# Patient Record
Sex: Male | Born: 1960 | Race: Black or African American | Hispanic: No | Marital: Married | Smoking: Former smoker
Health system: Southern US, Community
[De-identification: ages and names within clinical notes are randomized; demographics above are authoritative.]

## PROBLEM LIST (undated history)

## (undated) DIAGNOSIS — A048 Other specified bacterial intestinal infections: Secondary | ICD-10-CM

## (undated) DIAGNOSIS — K219 Gastro-esophageal reflux disease without esophagitis: Secondary | ICD-10-CM

## (undated) HISTORY — PX: NO PAST SURGERIES: SHX2092

## (undated) HISTORY — DX: Gastro-esophageal reflux disease without esophagitis: K21.9

## (undated) HISTORY — DX: Other specified bacterial intestinal infections: A04.8

---

## 2009-03-27 ENCOUNTER — Emergency Department (HOSPITAL_COMMUNITY): Admission: EM | Admit: 2009-03-27 | Discharge: 2009-03-27 | Payer: Self-pay | Admitting: Emergency Medicine

## 2016-03-07 ENCOUNTER — Ambulatory Visit (INDEPENDENT_AMBULATORY_CARE_PROVIDER_SITE_OTHER): Payer: Self-pay | Admitting: Physician Assistant

## 2016-03-07 ENCOUNTER — Ambulatory Visit (INDEPENDENT_AMBULATORY_CARE_PROVIDER_SITE_OTHER): Payer: Self-pay

## 2016-03-07 VITALS — BP 120/78 | HR 95 | Temp 97.9°F | Resp 17 | Wt 166.0 lb

## 2016-03-07 DIAGNOSIS — R079 Chest pain, unspecified: Secondary | ICD-10-CM

## 2016-03-07 LAB — POCT CBC
Granulocyte percent: 47.1 %G (ref 37–80)
HCT, POC: 39.9 % — AB (ref 43.5–53.7)
Hemoglobin: 13.7 g/dL — AB (ref 14.1–18.1)
LYMPH, POC: 1.6 (ref 0.6–3.4)
MCH, POC: 28.1 pg (ref 27–31.2)
MCHC: 34.2 g/dL (ref 31.8–35.4)
MCV: 82.2 fL (ref 80–97)
MID (cbc): 0.3 (ref 0–0.9)
MPV: 7.3 fL (ref 0–99.8)
PLATELET COUNT, POC: 153 10*3/uL (ref 142–424)
POC Granulocyte: 1.7 — AB (ref 2–6.9)
POC LYMPH PERCENT: 44.2 %L (ref 10–50)
POC MID %: 8.7 %M (ref 0–12)
RBC: 4.86 M/uL (ref 4.69–6.13)
RDW, POC: 14.9 %
WBC: 3.7 10*3/uL — AB (ref 4.6–10.2)

## 2016-03-07 MED ORDER — OMEPRAZOLE 40 MG PO CPDR: DELAYED_RELEASE_CAPSULE | ORAL | 1 refills | Status: DC

## 2016-03-07 NOTE — Patient Instructions (Signed)
     IF you received an x-ray today, you will receive an invoice from [facility]. Please contact [facility] at [phone] with questions or concerns regarding your invoice.   IF you received labwork today, you will receive an invoice from [facility]. Please contact [facility] at [phone] with questions or concerns regarding your invoice.   Our billing staff will not be able to assist you with questions regarding bills from these companies.  You will be contacted with the lab results as soon as they are available. The fastest way to get your results is to activate your My Chart account. Instructions are located on the last page of this paperwork. If you have not heard from us regarding the results in 2 weeks, please contact this office.      

## 2016-03-07 NOTE — Progress Notes (Signed)
03/07/2016 6:11 PM   DOB: Aug 07, 1960 / MRN: [id number]  SUBJECTIVE:  Travis Oconnell is a 55 y.o. male presenting for centralized substernal chest pain that he describes as severe, and states "it feels like it is eating me from the inside out." This is made worse by eating, and particularly worse by eating heavy foods.  He is not sure if the pain is worse with eating. Denies dysphagia, odynophagia, food getting stuck.  Reports a 10 lbs weight loss over the last 2 months.  Can only tolerate rice and salads.  Feels he is getting worse. Associates one bloody stool about three weeks ago.   He has been coughing more lately, has a hard time catching his breath if he inhales any secondhand smoke or dust.  Denies any new DOE.  He is a never smoker and denies a history of asthma.  Has been tried on an inhaler and this did not really help.   He has no allergies on file.   He  has no past medical history on file.    He  reports that he has never smoked. He does not have any smokeless tobacco history on file. He  has no sexual activity history on file. The patient  has no past surgical history on file.  His family history is not on file.  Review of Systems  Constitutional: Positive for weight loss. Negative for chills, diaphoresis, fever and malaise/fatigue.  Respiratory: Positive for cough. Negative for hemoptysis, sputum production, shortness of breath and wheezing.   Cardiovascular: Positive for chest pain. Negative for palpitations, orthopnea, claudication, leg swelling and PND.  Gastrointestinal: Negative for nausea.  Genitourinary: Negative for dysuria.  Musculoskeletal: Negative for myalgias.  Skin: Negative for itching and rash.  Neurological: Negative for dizziness, weakness and headaches.    The problem list and medications were reviewed and updated by myself where necessary and exist elsewhere in the encounter.   OBJECTIVE:  BP 120/78 (BP Location: Right Arm, Patient Position: Sitting,  Cuff Size: Normal)   Pulse 95   Temp 97.9 F (36.6 C) (Oral)   Resp 17   Wt 166 lb (75.3 kg)   SpO2 98%   Physical Exam  Constitutional: He is oriented to person, place, and time.  Cardiovascular: Normal rate, regular rhythm, normal heart sounds and intact distal pulses.   Pulmonary/Chest: Effort normal and breath sounds normal. No respiratory distress. He has no wheezes. He has no rales. He exhibits no tenderness.  Abdominal: Soft. Bowel sounds are normal. He exhibits no distension and no mass. There is tenderness (left upper quadrant). There is no rebound and no guarding.  Musculoskeletal: Normal range of motion.  Neurological: He is alert and oriented to person, place, and time.    Results for orders placed or performed in visit on 03/07/16 (from the past 72 hour(s))  POCT CBC     Status: Abnormal   Collection Time: 03/07/16  5:38 PM  Result Value Ref Range   WBC 3.7 (A) 4.6 - 10.2 K/uL   Lymph, poc 1.6 0.6 - 3.4   POC LYMPH PERCENT 44.2 10 - 50 %L   MID (cbc) 0.3 0 - 0.9   POC MID % 8.7 0 - 12 %M   POC Granulocyte 1.7 (A) 2 - 6.9   Granulocyte percent 47.1 37 - 80 %G   RBC 4.86 4.69 - 6.13 M/uL   Hemoglobin 13.7 (A) 14.1 - 18.1 g/dL   HCT, POC 39.9 (A) 43.5 - 53.7 %  MCV 82.2 80 - 97 fL   MCH, POC 28.1 27 - 31.2 pg   MCHC 34.2 31.8 - 35.4 g/dL   RDW, POC 14.9 %   Platelet Count, POC 153 142 - 424 K/uL   MPV 7.3 0 - 99.8 fL    Dg Chest 2 View  Result Date: 03/07/2016 CLINICAL DATA:  Substernal chest pain. EXAM: CHEST  2 VIEW COMPARISON:  None. FINDINGS: The cardiac silhouette, mediastinal and hilar contours are within normal limits. There is mild tortuosity of the thoracic aorta. The lungs are clear. No pleural effusion. The bony thorax is intact. IMPRESSION: No acute cardiopulmonary findings. Electronically Signed   By: Marijo Sanes M.D.   On: 03/07/2016 18:06   EKG: NSR without evidence of hypertrophy, ischemia, or infarction.   ASSESSMENT AND PLAN  Donte was  seen today for abdominal pain and chest pain.  Diagnoses and all orders for this visit:  Chest pain, unspecified chest pain type: I have a strong clinical suspicion for GERD. He has some red flags: he is over the age of 76 and this is new for him.  He has lost weight. Unfortunately he dose not have health insurance. After giving him 300 mg of ranitidine his symptoms did remiss.  [id number].   -     DG Chest 2 View; Future -     EKG 12-Lead -     POCT CBC -     Lipase -     COMPLETE METABOLIC PANEL WITH GFR       -     H. Pylori breath test       -     AMB GI.   The patient is advised to call or return to clinic if he does not see an improvement in symptoms, or to seek the care of the closest emergency department if he worsens with the above plan.   Philis Fendt, MHS, PA-C [facility] 03/07/2016 6:11 PM

## 2016-03-08 LAB — COMPLETE METABOLIC PANEL WITH GFR
ALBUMIN: 3.7 g/dL (ref 3.6–5.1)
ALK PHOS: 49 U/L (ref 40–115)
ALT: 16 U/L (ref 9–46)
AST: 16 U/L (ref 10–35)
BUN: 15 mg/dL (ref 7–25)
CO2: 30 mmol/L (ref 20–31)
Calcium: 9.1 mg/dL (ref 8.6–10.3)
Chloride: 105 mmol/L (ref 98–110)
Creat: 1.07 mg/dL (ref 0.70–1.33)
GFR, EST NON AFRICAN AMERICAN: 77 mL/min (ref >=60)
GFR, Est African American: 89 mL/min (ref >=60)
GLUCOSE: 99 mg/dL (ref 65–99)
POTASSIUM: 4.7 mmol/L (ref 3.5–5.3)
Sodium: 141 mmol/L (ref 135–146)
Total Bilirubin: 0.5 mg/dL (ref 0.2–1.2)
Total Protein: 6.3 g/dL (ref 6.1–8.1)

## 2016-03-08 LAB — LIPASE: Lipase: 22 U/L (ref 7–60)

## 2016-03-09 ENCOUNTER — Other Ambulatory Visit: Payer: Self-pay | Admitting: Physician Assistant

## 2016-03-09 DIAGNOSIS — A048 Other specified bacterial intestinal infections: Secondary | ICD-10-CM

## 2016-03-09 LAB — H. PYLORI BREATH TEST: H. pylori Breath Test: DETECTED — AB

## 2016-03-09 MED ORDER — CLARITHROMYCIN 500 MG PO TABS: 500.0000 mg | ORAL_TABLET | Freq: Two times a day (BID) | ORAL | 0 refills | Status: DC

## 2016-03-09 MED ORDER — AMOXICILLIN 500 MG PO CAPS: 1000.0000 mg | ORAL_CAPSULE | Freq: Two times a day (BID) | ORAL | 0 refills | Status: DC

## 2016-03-09 NOTE — Progress Notes (Signed)
Jocelyn please relay treatment regimen to patient. Philis Fendt, MS, PA-C 5:03 PM, 03/09/2016

## 2016-04-02 NOTE — Progress Notes (Signed)
Patient's brother was made aware of treatment on 03/09/2016. Please see documentation note

## 2017-06-18 IMAGING — DX DG CHEST 2V
2 series · 2 of 2 positions shown · non-contrast
Comparison: None.

CLINICAL DATA: Substernal chest pain.

EXAM:
CHEST  2 VIEW

[chest pa]
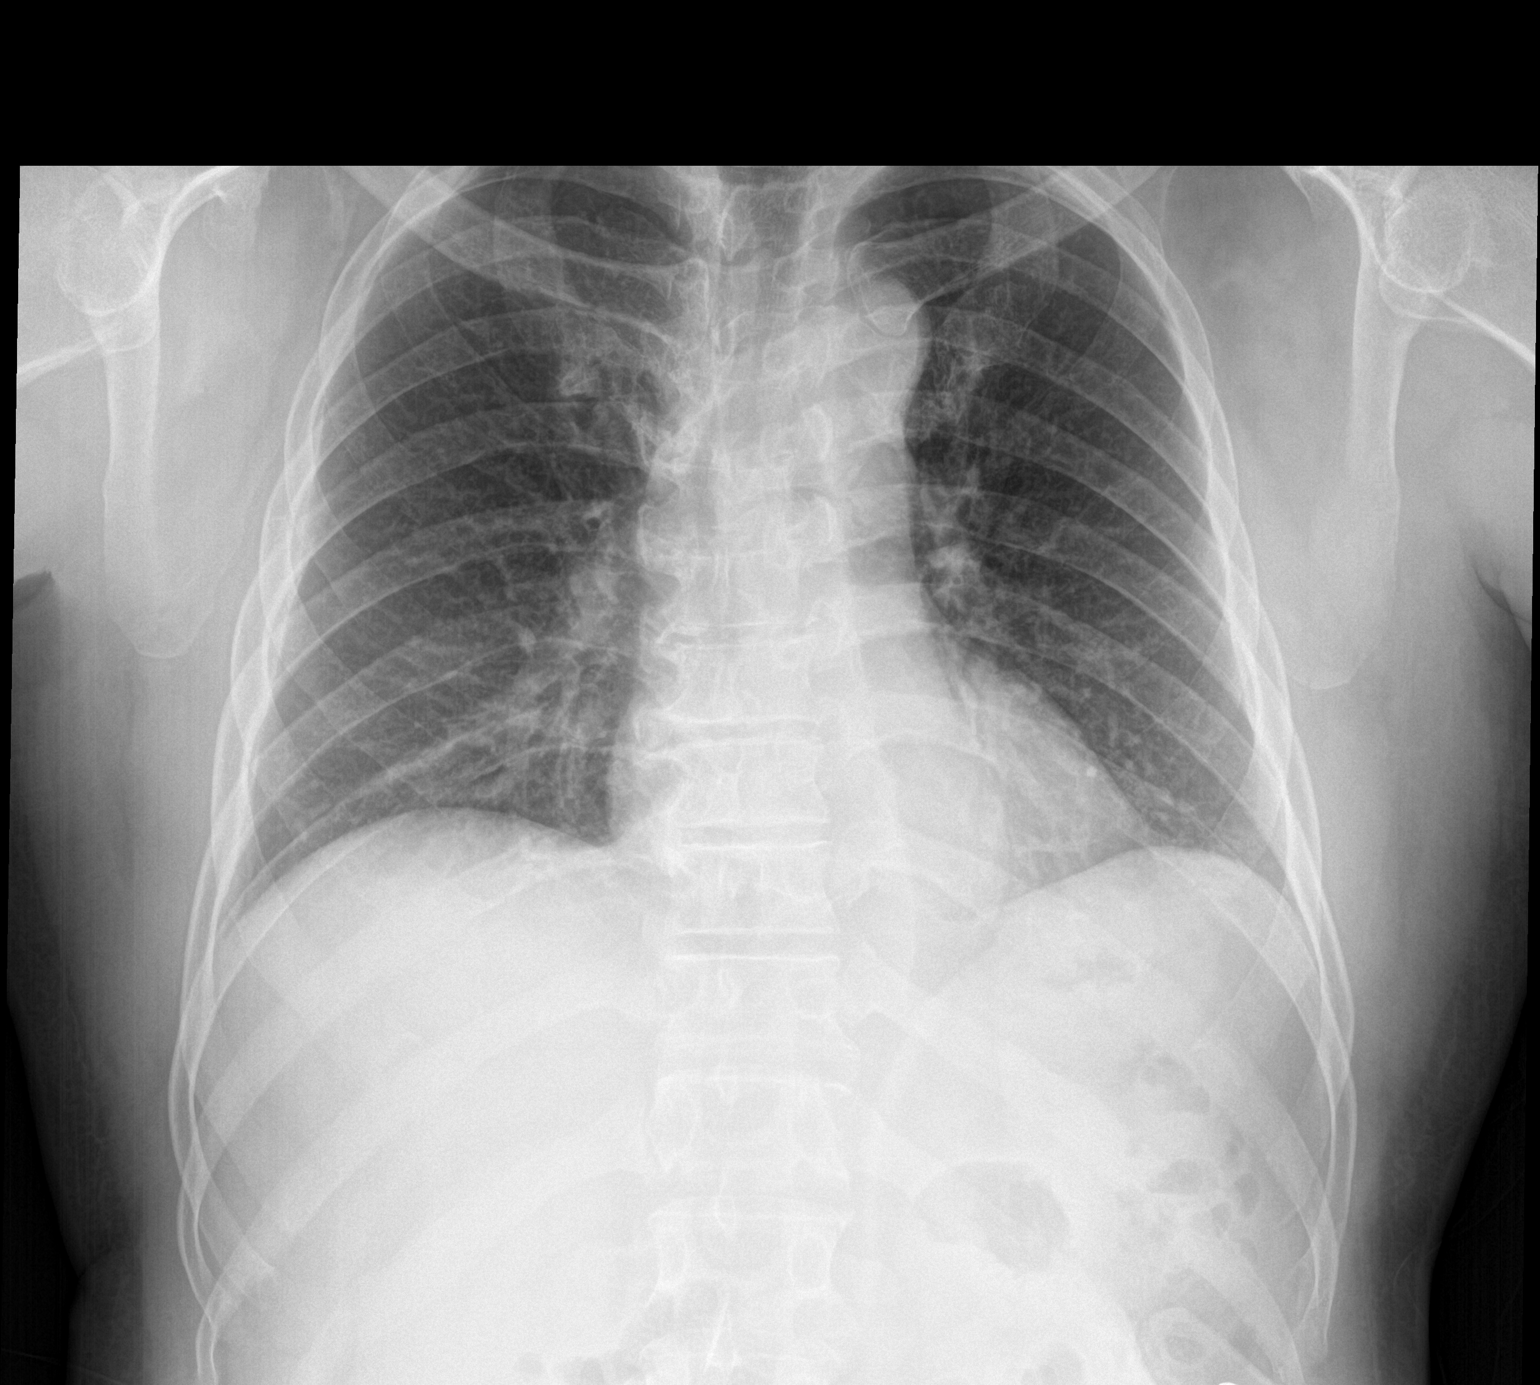

[chest lat]
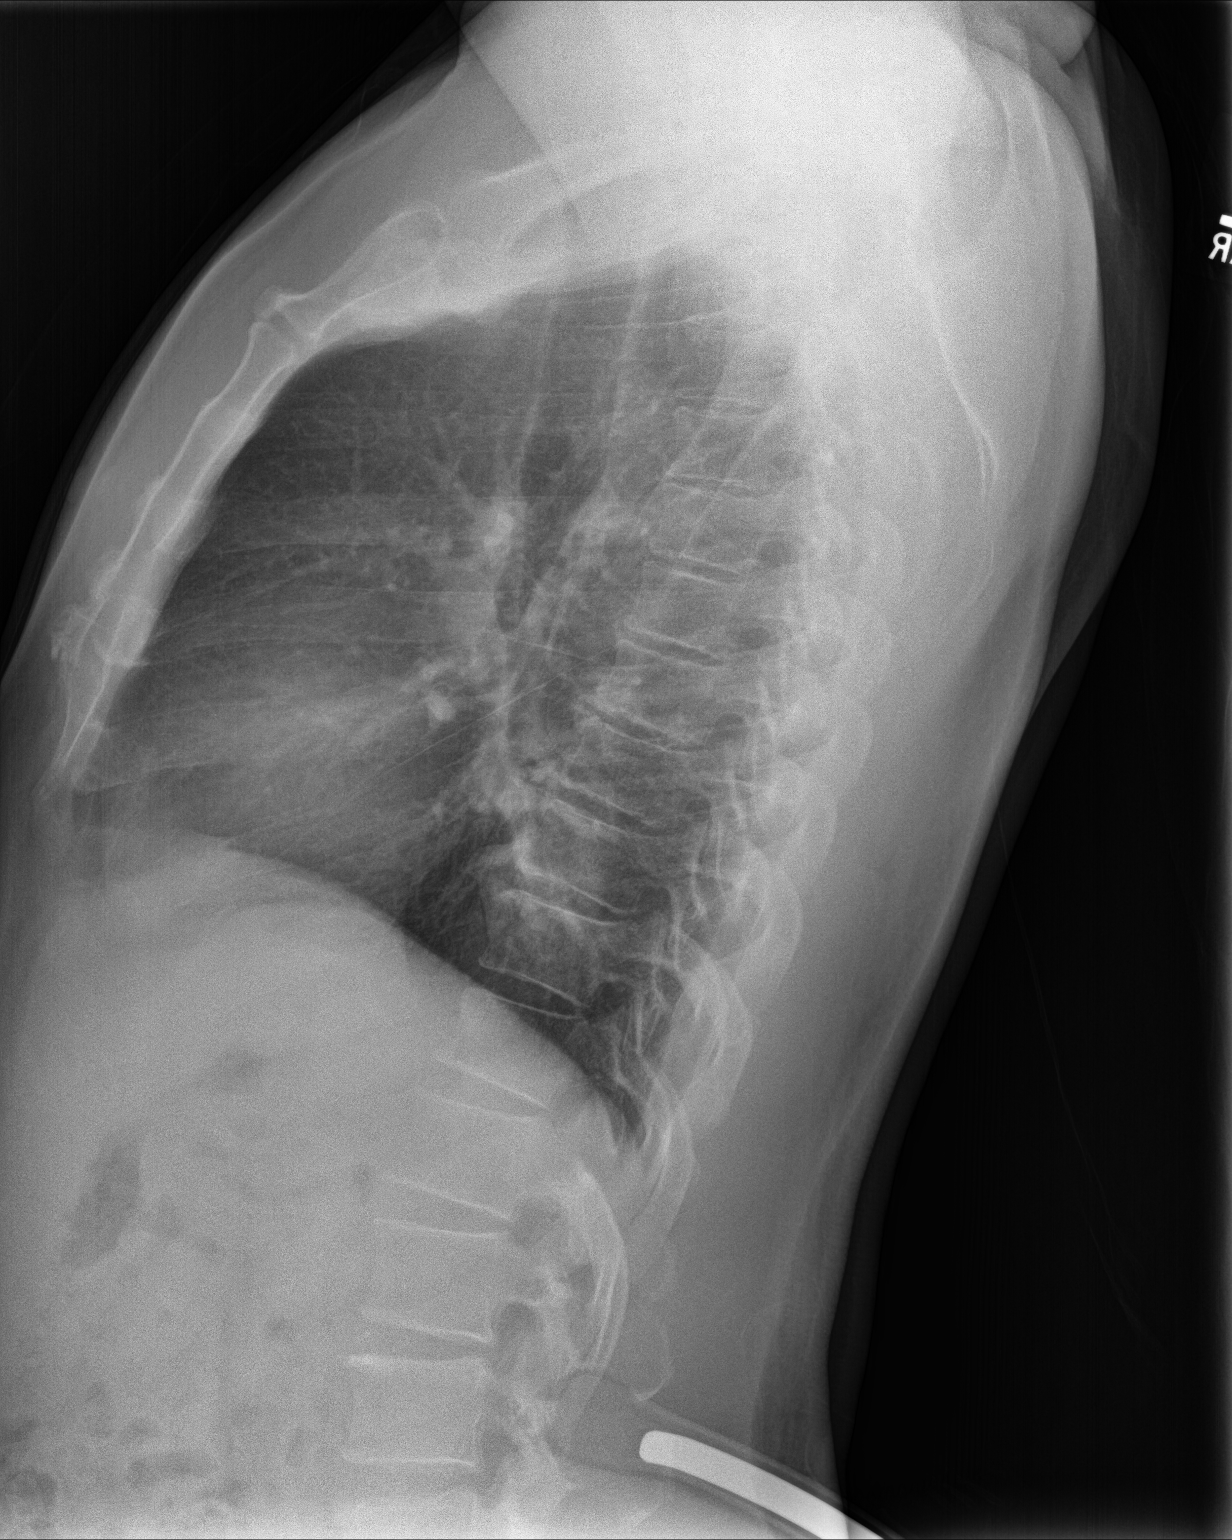

[2 of 2 positions shown; findings below may reference images not displayed]

FINDINGS: The cardiac silhouette, mediastinal and hilar contours are within
normal limits. There is mild tortuosity of the thoracic aorta. The
lungs are clear. No pleural effusion. The bony thorax is intact.
IMPRESSION: No acute cardiopulmonary findings.

## 2017-07-29 ENCOUNTER — Ambulatory Visit: Payer: Self-pay | Admitting: Physician Assistant

## 2017-07-29 ENCOUNTER — Encounter: Payer: Self-pay | Admitting: Physician Assistant

## 2017-07-29 VITALS — BP 110/60 | HR 78 | Temp 98.0°F | Resp 16 | Ht <= 58 in | Wt 168.0 lb

## 2017-07-29 DIAGNOSIS — K2900 Acute gastritis without bleeding: Secondary | ICD-10-CM

## 2017-07-29 DIAGNOSIS — R0789 Other chest pain: Secondary | ICD-10-CM

## 2017-07-29 MED ORDER — OMEPRAZOLE 20 MG PO CPDR: 20.0000 mg | DELAYED_RELEASE_CAPSULE | Freq: Every day | ORAL | 3 refills | Status: DC

## 2017-07-29 MED ORDER — RANITIDINE HCL 150 MG PO TABS: 150.0000 mg | ORAL_TABLET | Freq: Two times a day (BID) | ORAL | 2 refills | Status: DC

## 2017-07-29 NOTE — Patient Instructions (Addendum)
I would like you to continue the zantac.  I have ordered more in case you need it.  I would like you to take these as prescribed.  I am also adding omeprazole.  This is likely gastritis.  Please avoid advil use at this time, and follow the diet below.  I will have your lab results shortly.  Food Choices for Gastroesophageal Reflux Disease, Adult When you have gastroesophageal reflux disease (GERD), the foods you eat and your eating habits are very important. Choosing the right foods can help ease your discomfort. What guidelines do I need to follow?  Choose fruits, vegetables, whole grains, and low-fat dairy products.  Choose low-fat meat, fish, and poultry.  Limit fats such as oils, salad dressings, butter, nuts, and avocado.  Keep a food diary. This helps you identify foods that cause symptoms.  Avoid foods that cause symptoms. These may be different for everyone.  Eat small meals often instead of 3 large meals a day.  Eat your meals slowly, in a place where you are relaxed.  Limit fried foods.  Cook foods using methods other than frying.  Avoid drinking alcohol.  Avoid drinking large amounts of liquids with your meals.  Avoid bending over or lying down until 2-3 hours after eating. What foods are not recommended? These are some foods and drinks that may make your symptoms worse: Vegetables Tomatoes. Tomato juice. Tomato and spaghetti sauce. Chili peppers. Onion and garlic. Horseradish. Fruits Oranges, grapefruit, and lemon (fruit and juice). Meats High-fat meats, fish, and poultry. This includes hot dogs, ribs, ham, sausage, salami, and bacon. Dairy Whole milk and chocolate milk. Sour cream. Cream. Butter. Ice cream. Cream cheese. Drinks Coffee and tea. Bubbly (carbonated) drinks or energy drinks. Condiments Hot sauce. Barbecue sauce. Sweets/Desserts Chocolate and cocoa. Donuts. Peppermint and spearmint. Fats and Oils High-fat foods. This includes [place] fries and  potato chips. Other Vinegar. Strong spices. This includes black pepper, white pepper, red pepper, cayenne, curry powder, cloves, ginger, and chili powder. The items listed above may not be a complete list of foods and drinks to avoid. Contact your dietitian for more information. This information is not intended to replace advice given to you by your health care provider. Make sure you discuss any questions you have with your health care provider. Document Released: 12/04/2011 Document Revised: 11/10/2015 Document Reviewed: 04/08/2013 Elsevier Interactive Patient Education  2017 [employer].      IF you received an x-ray today, you will receive an invoice from [facility]. Please contact [facility] at [phone] with questions or concerns regarding your invoice.   IF you received labwork today, you will receive an invoice from [facility]. Please contact [facility] at [phone] with questions or concerns regarding your invoice.   Our billing staff will not be able to assist you with questions regarding bills from these companies.  You will be contacted with the lab results as soon as they are available. The fastest way to get your results is to activate your My Chart account. Instructions are located on the last page of this paperwork. If you have not heard from [place] regarding the results in 2 weeks, please contact this office.

## 2017-07-29 NOTE — Progress Notes (Signed)
PRIMARY CARE AT [place], [place] [address] [phone]  Date:  07/29/2017   Name:  Manish Ruggiero   DOB:  September 16, 1960   MRN:  [id number]  PCP:  Patient, No Pcp Per    History of Present Illness:  Conner Muegge is a 57 y.o. male patient who presents to PCP with  Chief Complaint  Patient presents with  . Chest Pain     Patient is here with friend interpreter.   1 week ago, pain and pressure at his upper abdomen lower chest. Nausea with vomiting.   Aggravated by bending over and deep breathing.  Worse symptoms with lying down on back. Pressure radiates to head No black or bloody stool No dizziness No shortness of breath. No diaphoresis. Symptoms onset not associated with exertion. He has been taking zantac which may help. He eats rice and spicy foods.  Has very little coffee, and eats greasy foods. He is not coughing. Symptoms similar to 2 years ago.  This was dx as H pylori with positive testing.  Regimen compliant and notes symptom free until this last week.    There are no active problems to display for this patient.   History reviewed. No pertinent past medical history.  History reviewed. No pertinent surgical history.  Social History   Tobacco Use  . Smoking status: Never Smoker  Substance Use Topics  . Alcohol use: Not on file  . Drug use: Not on file    History reviewed. No pertinent family history.  Not on File  Medication list has been reviewed and updated.  Current Outpatient Medications on File Prior to Visit  Medication Sig Dispense Refill  . amoxicillin (AMOXIL) 500 MG capsule Take 2 capsules (1,000 mg total) by mouth 2 (two) times daily. Continue omeprazole. 28 capsule 0  . clarithromycin (BIAXIN) 500 MG tablet Take 1 tablet (500 mg total) by mouth 2 (two) times daily. Continue omeprazole. 14 tablet 0  . omeprazole (PRILOSEC) 40 MG capsule Take daily 30 mins before the first meal of the day, and always eat after taking the  medication. 30 capsule 1   No current facility-administered medications on file prior to visit.     ROS ROS otherwise unremarkable unless listed above.  Physical Examination: BP 110/60   Pulse 78   Temp 98 F (36.7 C) (Oral)   Resp 16   Ht 1' (0.305 m)   Wt 168 lb (76.2 kg)   SpO2 98%   BMI 820.26 kg/m  Ideal Body Weight: Weight in (lb) to have BMI = 25: 5.1  Physical Exam  Constitutional: He is oriented to person, place, and time. He appears well-developed and well-nourished. No distress.  HENT:  Head: Normocephalic and atraumatic.  Eyes: Conjunctivae and EOM are normal. Pupils are equal, round, and reactive to light.  Cardiovascular: Normal rate.  Pulmonary/Chest: Effort normal. No respiratory distress.  Neurological: He is alert and oriented to person, place, and time.  Skin: Skin is warm and dry. He is not diaphoretic.  Psychiatric: He has a normal mood and affect. His behavior is normal.   EKG: normal EKG, normal sinus rhythm, no acute findings.  Assessment and Plan: Cordarryl Monrreal is a 57 y.o. male who is here today for cc of  Chief Complaint  Patient presents with  . Chest Pain  obtain  H pylori at this time.   Started on ppi with the h2 blocker at the suggested regimen.  Acute gastritis without hemorrhage, unspecified gastritis type -  Plan: omeprazole (PRILOSEC) 20 MG capsule, ranitidine (ZANTAC) 150 MG tablet  Other chest pain - Plan: EKG 12-Lead, H. pylori breath test, ranitidine (ZANTAC) 150 MG tablet  Ivar Drape, PA-C [facility] 2/12/201910:14 AM

## 2017-07-30 LAB — H. PYLORI BREATH TEST: H PYLORI BREATH TEST: POSITIVE — AB

## 2017-08-06 ENCOUNTER — Other Ambulatory Visit: Payer: Self-pay | Admitting: Physician Assistant

## 2017-08-06 DIAGNOSIS — A048 Other specified bacterial intestinal infections: Secondary | ICD-10-CM

## 2017-08-06 DIAGNOSIS — K2900 Acute gastritis without bleeding: Secondary | ICD-10-CM

## 2017-08-06 MED ORDER — AMOXICILLIN 500 MG PO CAPS
1000.0000 mg | ORAL_CAPSULE | Freq: Two times a day (BID) | ORAL | 0 refills | Status: DC
Start: 2017-08-06 — End: 2017-08-30

## 2017-08-06 MED ORDER — OMEPRAZOLE 20 MG PO CPDR: 20.0000 mg | DELAYED_RELEASE_CAPSULE | Freq: Two times a day (BID) | ORAL | 0 refills | Status: DC

## 2017-08-06 MED ORDER — CLARITHROMYCIN 500 MG PO TABS: 500.0000 mg | ORAL_TABLET | Freq: Two times a day (BID) | ORAL | 0 refills | Status: DC

## 2017-08-28 ENCOUNTER — Ambulatory Visit: Payer: Self-pay | Admitting: Physician Assistant

## 2017-08-30 ENCOUNTER — Encounter: Payer: Self-pay | Admitting: Physician Assistant

## 2017-08-30 ENCOUNTER — Other Ambulatory Visit: Payer: Self-pay

## 2017-08-30 ENCOUNTER — Ambulatory Visit: Payer: Self-pay | Admitting: Physician Assistant

## 2017-08-30 VITALS — BP 110/64 | HR 80 | Temp 97.9°F | Resp 16 | Ht 66.0 in | Wt 165.6 lb

## 2017-08-30 DIAGNOSIS — K219 Gastro-esophageal reflux disease without esophagitis: Secondary | ICD-10-CM

## 2017-08-30 DIAGNOSIS — Z23 Encounter for immunization: Secondary | ICD-10-CM

## 2017-08-30 LAB — POCT CBC
Granulocyte percent: 44.5 %G (ref 37–80)
HCT, POC: 46.6 % (ref 43.5–53.7)
HEMOGLOBIN: 14.9 g/dL (ref 14.1–18.1)
Lymph, poc: 1.4 (ref 0.6–3.4)
MCH: 26.6 pg — AB (ref 27–31.2)
MCHC: 32 g/dL (ref 31.8–35.4)
MCV: 83.3 fL (ref 80–97)
MID (cbc): 0.2 (ref 0–0.9)
MPV: 7.3 fL (ref 0–99.8)
PLATELET COUNT, POC: 190 10*3/uL (ref 142–424)
POC Granulocyte: 1.2 — AB (ref 2–6.9)
POC LYMPH PERCENT: 49.3 %L (ref 10–50)
POC MID %: 6.2 %M (ref 0–12)
RBC: 5.59 M/uL (ref 4.69–6.13)
RDW, POC: 14.1 %
WBC: 2.8 10*3/uL — AB (ref 4.6–10.2)

## 2017-08-30 LAB — IFOBT (OCCULT BLOOD): IFOBT: NEGATIVE

## 2017-08-30 MED ORDER — METRONIDAZOLE 500 MG PO TABS: 500.0000 mg | ORAL_TABLET | Freq: Two times a day (BID) | ORAL | 0 refills | Status: DC

## 2017-08-30 MED ORDER — AMOXICILLIN 500 MG PO CAPS: 1000.0000 mg | ORAL_CAPSULE | Freq: Two times a day (BID) | ORAL | 0 refills | Status: DC

## 2017-08-30 MED ORDER — CLARITHROMYCIN 250 MG PO TABS: 500.0000 mg | ORAL_TABLET | Freq: Two times a day (BID) | ORAL | 0 refills | Status: DC

## 2017-08-30 MED ORDER — PANTOPRAZOLE SODIUM 40 MG PO TBEC: 40.0000 mg | DELAYED_RELEASE_TABLET | Freq: Every day | ORAL | 11 refills | Status: DC

## 2017-08-30 NOTE — Patient Instructions (Addendum)
DO not miss doses of antibiotics.  Take pantoprazole first thing every day then take your first dose of antibiotics 30 minutes later with a hearty breakfast. Take you antibiotics with a hearty meal at night.     IF you received an x-ray today, you will receive an invoice from [facility]. Please contact [facility] at [phone] with questions or concerns regarding your invoice.   IF you received labwork today, you will receive an invoice from [facility]. Please contact [facility] at [phone] with questions or concerns regarding your invoice.   Our billing staff will not be able to assist you with questions regarding bills from these companies.  You will be contacted with the lab results as soon as they are available. The fastest way to get your results is to activate your My Chart account. Instructions are located on the last page of this paperwork. If you have not heard from [place] regarding the results in 2 weeks, please contact this office.

## 2017-08-30 NOTE — Progress Notes (Signed)
09/01/2017 12:18 PM   DOB: [birth year] / MRN: [id number]  SUBJECTIVE:  Travis Oconnell is a 57 y.o. male presenting for reheck GERD symptoms. + H. Pylori at his last visit.  Failed triple therapy. Out of PPI and symptoms are back. No bloody stools, SOB, DOE, leg swelling. Last EKG normal.   He has No Known Allergies.   He  has no past medical history on file.    He  reports that  has never smoked. he has never used smokeless tobacco. He  has no sexual activity history on file. The patient  has no past surgical history on file.  His family history is not on file.  Review of Systems  Constitutional: Negative for chills, diaphoresis and fever.  Eyes: Negative.   Respiratory: Negative for cough, hemoptysis, sputum production, shortness of breath and wheezing.   Cardiovascular: Negative for chest pain, orthopnea and leg swelling.  Gastrointestinal: Negative for abdominal pain, blood in stool, constipation, diarrhea, heartburn, melena, nausea and vomiting.  Genitourinary: Negative for flank pain.  Skin: Negative for rash.  Neurological: Negative for dizziness, sensory change, speech change, focal weakness and headaches.    The problem list and medications were reviewed and updated by myself where necessary and exist elsewhere in the encounter.   OBJECTIVE:  BP 110/64 (BP Location: Left Arm, Patient Position: Sitting, Cuff Size: Normal)   Pulse 80   Temp 97.9 F (36.6 C)   Resp 16   Ht 5\' 6"  (1.676 m)   Wt 165 lb 9.6 oz (75.1 kg)   SpO2 97%   BMI 26.73 kg/m   Physical Exam  Constitutional: He appears well-developed. He is active and cooperative.  Non-toxic appearance.  Cardiovascular: Normal rate, regular rhythm, S1 normal, S2 normal, normal heart sounds, intact distal pulses and normal pulses. Exam reveals no gallop and no friction rub.  No murmur heard. Pulmonary/Chest: Effort normal. No stridor. No tachypnea. No respiratory distress. He has no wheezes. He has no rales.    Abdominal: Soft. Normal appearance and bowel sounds are normal. He exhibits no distension and no mass. There is no tenderness. There is no rigidity, no rebound, no guarding and no CVA tenderness. No hernia.  Genitourinary: Rectal exam shows guaiac negative stool.  Musculoskeletal: He exhibits no edema.  Neurological: He is alert.  Skin: Skin is warm and dry. He is not diaphoretic. No pallor.  Vitals reviewed.   Results for orders placed or performed in visit on 08/30/17 (from the past 72 hour(s))  IFOBT POC (occult bld, rslt in office)     Status: None   Collection Time: 08/30/17  4:52 PM  Result Value Ref Range   IFOBT Negative   POCT CBC     Status: Abnormal   Collection Time: 08/30/17  5:12 PM  Result Value Ref Range   WBC 2.8 (A) 4.6 - 10.2 K/uL   Lymph, poc 1.4 0.6 - 3.4   POC LYMPH PERCENT 49.3 10 - 50 %L   MID (cbc) 0.2 0 - 0.9   POC MID % 6.2 0 - 12 %M   POC Granulocyte 1.2 (A) 2 - 6.9   Granulocyte percent 44.5 37 - 80 %G   RBC 5.59 4.69 - 6.13 M/uL   Hemoglobin 14.9 14.1 - 18.1 g/dL   HCT, POC 46.6 43.5 - 53.7 %   MCV 83.3 80 - 97 fL   MCH, POC 26.6 (A) 27 - 31.2 pg   MCHC 32.0 31.8 - 35.4 g/dL  RDW, POC 14.1 %   Platelet Count, POC 190 142 - 424 K/uL   MPV 7.3 0 - 99.8 fL    No results found.  ASSESSMENT AND PLAN:  Travis Oconnell was seen today for chest pain/pain and pressure in upper abdomen and lower ches.  Diagnoses and all orders for this visit:  Gastroesophageal reflux disease, esophagitis presence not specified -     IFOBT POC (occult bld, rslt in office); Future -     POCT CBC -     IFOBT POC (occult bld, rslt in office) -     pantoprazole (PROTONIX) 40 MG tablet; Take 1 tablet (40 mg total) by mouth daily. Take thirty minutes before the first meal of the day. -     amoxicillin (AMOXIL) 500 MG capsule; Take 2 capsules (1,000 mg total) by mouth 2 (two) times daily. -     metroNIDAZOLE (FLAGYL) 500 MG tablet; Take 1 tablet (500 mg total) by mouth 2 (two)  times daily. -     clarithromycin (BIAXIN) 250 MG tablet; Take 2 tablets (500 mg total) by mouth 2 (two) times daily.  Need for prophylactic vaccination and inoculation against influenza -     Flu Vaccine QUAD 36+ mos IM  Other orders -     Cancel: Tdap vaccine greater than or equal to 7yo IM -     Cancel: Ambulatory referral to Gastroenterology    The patient is advised to call or return to clinic if he does not see an improvement in symptoms, or to seek the care of the closest emergency department if he worsens with the above plan.   Philis Fendt, MHS, PA-C [facility] 09/01/2017 12:18 PM

## 2017-09-16 ENCOUNTER — Encounter: Payer: Self-pay | Admitting: Physician Assistant

## 2020-07-06 ENCOUNTER — Emergency Department (HOSPITAL_COMMUNITY)
Admission: EM | Admit: 2020-07-06 | Discharge: 2020-07-07 | Disposition: A | Discharge status: Home / Self Care | Payer: Self-pay | Attending: Emergency Medicine | Admitting: Emergency Medicine

## 2020-07-06 ENCOUNTER — Other Ambulatory Visit: Payer: Self-pay

## 2020-07-06 ENCOUNTER — Encounter (HOSPITAL_COMMUNITY): Payer: Self-pay

## 2020-07-06 DIAGNOSIS — K2950 Unspecified chronic gastritis without bleeding: Secondary | ICD-10-CM | POA: Insufficient documentation

## 2020-07-06 DIAGNOSIS — K625 Hemorrhage of anus and rectum: Secondary | ICD-10-CM | POA: Insufficient documentation

## 2020-07-06 LAB — COMPREHENSIVE METABOLIC PANEL
ALT: 16 U/L (ref 0–44)
AST: 22 U/L (ref 15–41)
Albumin: 4.1 g/dL (ref 3.5–5.0)
Alkaline Phosphatase: 73 U/L (ref 38–126)
Anion gap: 10 (ref 5–15)
BUN: 10 mg/dL (ref 8–23)
CO2: 28 mmol/L (ref 22–32)
Calcium: 9.5 mg/dL (ref 8.9–10.3)
Chloride: 103 mmol/L (ref 98–111)
Creatinine, Ser: 0.93 mg/dL (ref 0.61–1.24)
GFR, Estimated: >60 mL/min (ref >60)
Glucose, Bld: 122 mg/dL — ABNORMAL HIGH (ref 70–99)
Potassium: 4.1 mmol/L (ref 3.5–5.1)
Sodium: 141 mmol/L (ref 135–145)
Total Bilirubin: 0.7 mg/dL (ref 0.3–1.2)
Total Protein: 7.2 g/dL (ref 6.5–8.1)

## 2020-07-06 LAB — CBC
HCT: 43.9 % (ref 39.0–52.0)
Hemoglobin: 14.2 g/dL (ref 13.0–17.0)
MCH: 27.4 pg (ref 26.0–34.0)
MCHC: 32.3 g/dL (ref 30.0–36.0)
MCV: 84.6 fL (ref 80.0–100.0)
Platelets: 217 10*3/uL (ref 150–400)
RBC: 5.19 MIL/uL (ref 4.22–5.81)
RDW: 13.6 % (ref 11.5–15.5)
WBC: 3 10*3/uL — ABNORMAL LOW (ref 4.0–10.5)
nRBC: 0 % (ref 0.0–0.2)

## 2020-07-06 LAB — URINALYSIS, ROUTINE W REFLEX MICROSCOPIC
RBC / HPF: >50 RBC/hpf — ABNORMAL HIGH (ref 0–5)
WBC, UA: >50 WBC/hpf — ABNORMAL HIGH (ref 0–5)

## 2020-07-06 LAB — TYPE AND SCREEN
ABO/RH(D): O POS
Antibody Screen: NEGATIVE

## 2020-07-06 LAB — LIPASE, BLOOD: Lipase: 26 U/L (ref 11–51)

## 2020-07-06 NOTE — ED Triage Notes (Signed)
Patient reports abdominal pain and gi bleeding x 5 days.

## 2020-07-07 LAB — URINALYSIS, ROUTINE W REFLEX MICROSCOPIC
Bilirubin Urine: NEGATIVE
Glucose, UA: NEGATIVE mg/dL
Hgb urine dipstick: NEGATIVE
Ketones, ur: NEGATIVE mg/dL
Leukocytes,Ua: NEGATIVE
Nitrite: NEGATIVE
Protein, ur: NEGATIVE mg/dL
Specific Gravity, Urine: 1.01 (ref 1.005–1.030)
pH: 8 (ref 5.0–8.0)

## 2020-07-07 LAB — POC OCCULT BLOOD, ED: Fecal Occult Bld: POSITIVE — AB

## 2020-07-07 MED ORDER — OMEPRAZOLE 40 MG PO CPDR: 40.0000 mg | DELAYED_RELEASE_CAPSULE | Freq: Every day | ORAL | 1 refills | Status: DC

## 2020-07-07 MED ORDER — DOCUSATE SODIUM 100 MG PO CAPS: 100.0000 mg | ORAL_CAPSULE | Freq: Two times a day (BID) | ORAL | 1 refills | Status: DC

## 2020-07-07 NOTE — Discharge Instructions (Signed)
1.  It is very important that you get scheduled for upper and lower endoscopies.  These have to be scheduled through gastroenterology specialist.  You have been provided with the clinic name for [facility] gastroenterology.  Call to try to schedule an appointment as soon as possible.  You may be asked to find a family physician first to do a referral.  You may contact [facility] as in your discharge instructions or use the referral number in your discharge instructions to find a doctor. 2.  Start taking omeprazole as prescribed.  Take Colace twice daily as prescribed.  Follow instructions for gastritis and hemorrhoids in your discharge instructions 3.  Return to the emergency department if you have increased bleeding, increased pain, develop lightheadedness or feel he will pass out or other concerning symptoms.

## 2020-07-07 NOTE — ED Notes (Signed)
Patient verbalizes understanding of discharge instructions. Opportunity for questioning and answers were provided. Armband removed by staff, pt discharged from ED.  

## 2020-07-07 NOTE — ED Provider Notes (Signed)
[facility] EMERGENCY DEPARTMENT Provider Note   CSN: [id number] Arrival date & time: 07/06/20  2040     History Chief Complaint  Patient presents with  . GI Bleeding    Travis Oconnell is a 60 y.o. male.  HPI Patient reports he has had problems for about 5 years with burning epigastric pain.  He reports he has taken Zantac in the past for this.  Symptoms however persist despite taking medications and if he stops he gets worse.  He denies any vomiting.  He has not vomited any bloody looking material.  He reports also he has had some rectal bleeding.  Blood has been bright red in appearance.  This is happened several times over the past 5 days.  Patient denies he is experiencing any lower abdominal or rectal pain.  Patient denies he has any lightheadedness or dizziness.  He does not feel like he will pass out.  He does report sometimes he has constipation and has to strain to have a bowel movement patient denies he is having the back pain.  He is not having any pain urgency or burning with urination.  He is otherwise healthy.  He has no medical problems.  He does not have hypertension or diabetes, the only medication he takes is Zantac.    History reviewed. No pertinent past medical history.  There are no problems to display for this patient.   History reviewed. No pertinent surgical history.     History reviewed. No pertinent family history.  Social History   Tobacco Use  . Smoking status: Never Smoker  . Smokeless tobacco: Never Used    Home Medications Prior to Admission medications   Medication Sig Start Date End Date Taking? Authorizing Provider  docusate sodium (COLACE) 100 MG capsule Take 1 capsule (100 mg total) by mouth every 12 (twelve) hours. 07/07/20  Yes Charlesetta Shanks, MD  omeprazole (PRILOSEC OTC) 20 MG tablet Take 20 mg by mouth daily.   Yes [provider]  omeprazole (PRILOSEC) 40 MG capsule Take 1 capsule (40 mg total) by mouth  daily. 07/07/20  Yes Charlesetta Shanks, MD  pantoprazole (PROTONIX) 40 MG tablet Take 1 tablet (40 mg total) by mouth daily. Take thirty minutes before the first meal of the day. Patient not taking: Reported on 07/07/2020 08/30/17   Tereasa Coop, PA-C    Allergies    Patient has no known allergies.  Review of Systems   Review of Systems 10 systems reviewed and negative except as per HPI Physical Exam Updated Vital Signs BP 111/76   Pulse (!) 58   Temp 97.8 F (36.6 C) (Oral)   Resp 16   Ht 5\' 6"  (1.676 m)   Wt 75.1 kg   SpO2 98%   BMI 26.72 kg/m   Physical Exam Constitutional:      Appearance: Normal appearance. He is well-developed and well-nourished.  HENT:     Head: Normocephalic and atraumatic.  Eyes:     Extraocular Movements: Extraocular movements intact and EOM normal.  Cardiovascular:     Rate and Rhythm: Normal rate and regular rhythm.     Pulses: Intact distal pulses.     Heart sounds: Normal heart sounds.  Pulmonary:     Effort: Pulmonary effort is normal.     Breath sounds: Normal breath sounds.  Abdominal:     General: Bowel sounds are normal. There is no distension.     Palpations: Abdomen is soft.     Tenderness:  There is no abdominal tenderness.  Genitourinary:    Comments: Rectal exam shows a small nonthrombosed hemorrhoid.  Internal digital exam positive for what feels to be internal hemorrhoids, linear more firm ridges within the anal canal.  There is red blood on the glove after exam.  No appearance of melena. Stool portion of brown Musculoskeletal:        General: No edema. Normal range of motion.     Cervical back: Neck supple.     Right lower leg: No edema.     Left lower leg: No edema.  Skin:    General: Skin is warm, dry and intact.  Neurological:     Mental Status: He is alert and oriented to person, place, and time.     GCS: GCS eye subscore is 4. GCS verbal subscore is 5. GCS motor subscore is 6.     Coordination: Coordination normal.      Deep Tendon Reflexes: Strength normal.  Psychiatric:        Mood and Affect: Mood and affect normal.     ED Results / Procedures / Treatments   Labs (all labs ordered are listed, but only abnormal results are displayed)   EKG None  Radiology No results found.  Procedures Procedures (including critical care time)  Medications Ordered in ED Medications - No data to display  ED Course  I have reviewed the triage vital signs and the nursing notes.  Pertinent labs & imaging results that were available during my care of the patient were reviewed by me and considered in my medical decision making (see chart for details).      Patient describes years of gastritis type symptoms.  He has chronically been treated with Zantac.  He is not having any emesis or coffee-ground emesis.  Hemoglobin is normal and stable.  Digital rectal exam is suggestive of internal hemorrhoids.  There is a small nonthrombosed external hemorrhoid.  Patient is otherwise clinically well without other medical comorbidities or vital sign instability.  Incidentally, first urinalysis suggested gross blood and white cells.  None of this was consistent with the patient's review of systems.  He denies any urinary symptoms or unusual appearing urine.  Urine specimen recollected and submitted is normal.  First specimen is consistent with lab error.  Plan will be to start omeprazole daily for gastritis.  Patient is counseled on using Colace to avoid constipation and straining.  Patient is counseled on necessity of getting scheduled to do follow-up endoscopies to make sure no more serious issues such as colon cancer or significant sources of upper GI bleeding.  These instructions also included in discharge instructions.  Patient's brother speaks [place] well and has acted as Astronomer, with understanding of plan and follow-up needs. MDM Rules/Calculators/A&P                         Final Clinical Impression(s) / ED  Diagnoses Final diagnoses:  Rectal bleeding  Chronic gastritis, presence of bleeding unspecified, unspecified gastritis type    Rx / DC Orders ED Discharge Orders         Ordered    omeprazole (PRILOSEC) 40 MG capsule  Daily        07/07/20 1522    docusate sodium (COLACE) 100 MG capsule  Every 12 hours        07/07/20 1522           Charlesetta Shanks, MD 07/08/20 1039

## 2020-07-08 ENCOUNTER — Encounter: Payer: Self-pay | Admitting: Physician Assistant

## 2020-07-25 ENCOUNTER — Encounter: Payer: Self-pay | Admitting: Physician Assistant

## 2020-07-25 ENCOUNTER — Ambulatory Visit (INDEPENDENT_AMBULATORY_CARE_PROVIDER_SITE_OTHER): Payer: Self-pay | Admitting: Physician Assistant

## 2020-07-25 VITALS — BP 120/60 | HR 71 | Ht 64.25 in | Wt 160.0 lb

## 2020-07-25 DIAGNOSIS — K625 Hemorrhage of anus and rectum: Secondary | ICD-10-CM

## 2020-07-25 DIAGNOSIS — K219 Gastro-esophageal reflux disease without esophagitis: Secondary | ICD-10-CM

## 2020-07-25 DIAGNOSIS — R1013 Epigastric pain: Secondary | ICD-10-CM

## 2020-07-25 MED ORDER — OMEPRAZOLE 40 MG PO CPDR: 40.0000 mg | DELAYED_RELEASE_CAPSULE | Freq: Every day | ORAL | 6 refills | Status: DC

## 2020-07-25 NOTE — Progress Notes (Signed)
Subjective:    Patient ID: Travis Oconnell, male    DOB: 08/11/1960, 60 y.o.   MRN: [id number]  HPI Travis Oconnell is a pleasant 60 year old male from [place], who speaks [place] and [place].  He is referred today after recent ER visit with complaints of rectal bleeding.  He has not had any prior GI evaluation.  Patient had complained of some epigastric pain at the time he was seen in the emergency room and was started on a prescription for omeprazole.  Apparently prior to that he had been taking some OTC H2 blocker.  At the time of ER visit he had noticed bright red blood per rectum for the previous 5 days.  On exam he was noted to have a small external hemorrhoid, suggestion of internal hemorrhoids and was heme positive. Labs on 07/06/2020 show WBC of 3.0 hemoglobin 14.2 hematocrit of 43.9 MCV of 84.  Review of chart shows he was positive for H. pylori in 2019 and treated. No imaging done at the time of ER visit.  Patient says the rectal bleeding is completely subsided and has not seen any further blood over the past 10 days.  He has not noted any change in his bowel habits, denies any problems with constipation or diarrhea.  He has no complaints of abdominal pain today but says that he does get some subxiphoid discomfort intermittently which is sometimes worse with eating.  He denies any dysphagia or odynophagia.  He continues taking the omeprazole, he is not sure this is made a huge difference. He uses occasional NSAID not on a daily basis, no EtOH. Family history is negative for GI disease as far as he is aware.    Review of Systems Pertinent positive and negative review of systems were noted in the above HPI section.  All other review of systems was otherwise negative.  Outpatient Encounter Medications as of 07/25/2020  Medication Sig  . [DISCONTINUED] omeprazole (PRILOSEC) 40 MG capsule Take 1 capsule (40 mg total) by mouth daily.  Marland Kitchen omeprazole (PRILOSEC) 40 MG capsule Take 1 capsule (40 mg total)  by mouth daily.  . [DISCONTINUED] docusate sodium (COLACE) 100 MG capsule Take 1 capsule (100 mg total) by mouth every 12 (twelve) hours.  . [DISCONTINUED] omeprazole (PRILOSEC OTC) 20 MG tablet Take 20 mg by mouth daily.  . [DISCONTINUED] pantoprazole (PROTONIX) 40 MG tablet Take 1 tablet (40 mg total) by mouth daily. Take thirty minutes before the first meal of the day.   No facility-administered encounter medications on file as of 07/25/2020.   No Known Allergies There are no problems to display for this patient.  Social History   Socioeconomic History  . Marital status: Single    Spouse name: Not on file  . Number of children: 2  . Years of education: Not on file  . Highest education level: Not on file  Occupational History  . Occupation: self -employed  Tobacco Use  . Smoking status: Never Smoker  . Smokeless tobacco: Never Used  Vaping Use  . Vaping Use: Never used  Substance and Sexual Activity  . Alcohol use: Never  . Drug use: Never  . Sexual activity: Not on file  Other Topics Concern  . Not on file  Social History Narrative  . Not on file   Social Determinants of Health   Financial Resource Strain: Not on file  Food Insecurity: Not on file  Transportation Needs: Not on file  Physical Activity: Not on file  Stress: Not on  file  Social Connections: Not on file  Intimate Partner Violence: Not on file    Mr. Travis Oconnell family history is not on file.      Objective:    Vitals:   07/25/20 0955  BP: 120/60  Pulse: 71    Physical Exam Well-developed well-nourished African male in no acute distress.  Height, Weight, BMI 27.2 accompanied by a friend/interpreter.  HEENT; nontraumatic normocephalic, EOMI, PE R LA, sclera anicteric. Oropharynx; not examined today Neck; supple, no JVD Cardiovascular; regular rate and rhythm with S1-S2, no murmur rub or gallop Pulmonary; Clear bilaterally Abdomen; soft, there is mild tenderness in the epigastrium nondistended,  no palpable mass or hepatosplenomegaly, bowel sounds are active Rectal; not done today recently done in the ER with finding of small external hemorrhoid, he was heme positive Skin; benign exam, no jaundice rash or appreciable lesions Extremities; no clubbing cyanosis or edema skin warm and dry Neuro/Psych; alert and oriented x4, grossly nonfocal mood and affect appropriate      Assessment & Plan:   #24 60 year old male from [place], non-English-speaking/minimal English referred after ER visit with complaints of rectal bleeding. By description this was bright red blood with bowel movements which has since subsided.  Noted to have external hemorrhoids and probable internal hemorrhoids on rectal exam per ER provider.  Also documented heme positive  Bleeding may have been secondary to hemorrhoids, rule out occult colon lesion  #2 colon cancer surveillance-no prior colonoscopy #3 vague subxiphoid discomfort sometimes worse with eating-I suspect this is GERD related, rule out chronic gastropathy peptic ulcer disease  #4 H. pylori +2019 /treated  Plan; continue omeprazole 40 mg p.o. every morning, refill sent Avoid NSAIDs Patient will be scheduled for colonoscopy and upper endoscopy with Dr. Bryan Oconnell.  Both procedures were discussed in detail with the patient including indications risk and benefits and he is agreeable to proceed. Patient has completed COVID-19 vaccination Further recommendations pending findings at endoscopic evaluation  Travis Oconnell S Travis Dehaas PA-C 07/25/2020   Cc: No ref. provider found

## 2020-07-25 NOTE — Patient Instructions (Addendum)
If you are age 62 or younger, your body mass index should be between 19-25. Your Body mass index is 27.25 kg/m. If this is out of the aformentioned range listed, please consider follow up with your Primary Care Provider.   PROCEDURES:  You have been scheduled for an endoscopy and colonoscopy. Please follow the written instructions given to you at your visit today. Please pick up your prep supplies at the pharmacy within the next 1-3 days. If you use inhalers (even only as needed), please bring them with you on the day of your procedure.  MEDICATION  We have sent the following medication to your pharmacy for you to pick up at your convenience:  Omeprazole 40 MG once a day.  It was great seeing you today!  Thank you for entrusting me with your care and choosing [facility].

## 2020-07-29 NOTE — Progress Notes (Signed)
Agree with the assessment and plan as outlined by Amy Esterwood, PA-C.  Iosefa Weintraub, DO, FACG  

## 2020-08-01 ENCOUNTER — Encounter: Payer: Self-pay | Admitting: Gastroenterology

## 2020-08-01 ENCOUNTER — Ambulatory Visit (AMBULATORY_SURGERY_CENTER): Payer: Self-pay | Admitting: Gastroenterology

## 2020-08-01 ENCOUNTER — Other Ambulatory Visit: Payer: Self-pay

## 2020-08-01 VITALS — BP 114/71 | HR 64 | Temp 97.5°F | Resp 17

## 2020-08-01 DIAGNOSIS — K921 Melena: Secondary | ICD-10-CM

## 2020-08-01 DIAGNOSIS — K625 Hemorrhage of anus and rectum: Secondary | ICD-10-CM

## 2020-08-01 DIAGNOSIS — K2950 Unspecified chronic gastritis without bleeding: Secondary | ICD-10-CM

## 2020-08-01 DIAGNOSIS — K219 Gastro-esophageal reflux disease without esophagitis: Secondary | ICD-10-CM

## 2020-08-01 DIAGNOSIS — R1013 Epigastric pain: Secondary | ICD-10-CM

## 2020-08-01 DIAGNOSIS — K2970 Gastritis, unspecified, without bleeding: Secondary | ICD-10-CM

## 2020-08-01 HISTORY — PX: UPPER GASTROINTESTINAL ENDOSCOPY: SHX188

## 2020-08-01 MED ORDER — SODIUM CHLORIDE 0.9 % IV SOLN
500.0000 mL | INTRAVENOUS | Status: DC
Start: 2020-08-01 — End: 2020-08-01

## 2020-08-01 NOTE — Progress Notes (Signed)
Called to room to assist during endoscopic procedure.  Patient ID and intended procedure confirmed with present staff. Received instructions for my participation in the procedure from the performing physician.  

## 2020-08-01 NOTE — Op Note (Signed)
[facility] Patient Name: Travis Oconnell Procedure Date: 08/01/2020 2:45 PM MRN: [id number] Endoscopist: Gerrit Heck , MD Age: 60 Referring MD:  Date of Birth: 1960-12-30 Gender: Male Account #: [id number] Procedure:                Upper GI endoscopy Indications:              Epigastric abdominal pain, Hematochezia Medicines:                Monitored Anesthesia Care Procedure:                Pre-Anesthesia Assessment:                           - Prior to the procedure, a History and Physical                            was performed, and patient medications and                            allergies were reviewed. The patient's tolerance of                            previous anesthesia was also reviewed. The risks                            and benefits of the procedure and the sedation                            options and risks were discussed with the patient.                            All questions were answered, and informed consent                            was obtained. Prior Anticoagulants: The patient has                            taken no previous anticoagulant or antiplatelet                            agents. ASA Grade Assessment: II - A patient with                            mild systemic disease. After reviewing the risks                            and benefits, the patient was deemed in                            satisfactory condition to undergo the procedure.                           After obtaining informed consent, the endoscope was  passed under direct vision. Throughout the                            procedure, the patient's blood pressure, pulse, and                            oxygen saturations were monitored continuously. The                            Endoscope was introduced through the mouth, and                            advanced to the second part of duodenum. The upper                            GI endoscopy was  accomplished without difficulty.                            The patient tolerated the procedure well. Scope In: Scope Out: Findings:                 The examined esophagus was normal.                           The Z-line was regular and was found 40 cm from the                            incisors.                           The gastroesophageal flap valve was visualized                            endoscopically and classified as Hill Grade II                            (fold present, opens with respiration).                           The entire examined stomach was normal. Biopsies                            were taken with a cold forceps for Helicobacter                            pylori testing. Estimated blood loss was minimal.                           The examined duodenum was normal. Complications:            No immediate complications. Estimated Blood Loss:     Estimated blood loss was minimal. Impression:               - Normal esophagus.                           -  Z-line regular, 40 cm from the incisors.                           - Gastroesophageal flap valve classified as Hill                            Grade II (fold present, opens with respiration).                           - Normal stomach. Biopsied.                           - Normal examined duodenum. Recommendation:           - Patient has a contact number available for                            emergencies. The signs and symptoms of potential                            delayed complications were discussed with the                            patient. Return to normal activities tomorrow.                            Written discharge instructions were provided to the                            patient.                           - Resume previous diet.                           - Continue present medications.                           - Await pathology results.                           - Perform a colonoscopy at  appointment to be                            scheduled for evaluation of hematochezia and colon                            cancer screening. Gerrit Heck, MD 08/01/2020 3:00:03 PM

## 2020-08-01 NOTE — Patient Instructions (Signed)
Please read handouts provided. Continue present medications. Await pathology results. Colonoscopy scheduled Wed. August 24, 2020 @ 2;00pm,  arrival 1:00pm.    YOU HAD AN ENDOSCOPIC PROCEDURE TODAY AT [facility]:   Refer to the procedure report that was given to you for any specific questions about what was found during the examination.  If the procedure report does not answer your questions, please call your gastroenterologist to clarify.  If you requested that your care partner not be given the details of your procedure findings, then the procedure report has been included in a sealed envelope for you to review at your convenience later.  YOU SHOULD EXPECT: Some feelings of bloating in the abdomen. Passage of more gas than usual.  Walking can help get rid of the air that was put into your GI tract during the procedure and reduce the bloating. If you had a lower endoscopy (such as a colonoscopy or flexible sigmoidoscopy) you may notice spotting of blood in your stool or on the toilet paper. If you underwent a bowel prep for your procedure, you may not have a normal bowel movement for a few days.  Please Note:  You might notice some irritation and congestion in your nose or some drainage.  This is from the oxygen used during your procedure.  There is no need for concern and it should clear up in a day or so.  SYMPTOMS TO REPORT IMMEDIATELY:    Following upper endoscopy (EGD)  Vomiting of blood or coffee ground material  New chest pain or pain under the shoulder blades  Painful or persistently difficult swallowing  New shortness of breath  Fever of 100F or higher  Black, tarry-looking stools  For urgent or emergent issues, a gastroenterologist can be reached at any hour by calling [phone]. Do not use MyChart messaging for urgent concerns.    DIET:  We do recommend a small meal at first, but then you may proceed to your regular diet.  Drink plenty of fluids but you  should avoid alcoholic beverages for 24 hours.  ACTIVITY:  You should plan to take it easy for the rest of today and you should NOT DRIVE or use heavy machinery until tomorrow (because of the sedation medicines used during the test).    FOLLOW UP: Our staff will call the number listed on your records 48-72 hours following your procedure to check on you and address any questions or concerns that you may have regarding the information given to you following your procedure. If we do not reach you, we will leave a message.  We will attempt to reach you two times.  During this call, we will ask if you have developed any symptoms of COVID 19. If you develop any symptoms (ie: fever, flu-like symptoms, shortness of breath, cough etc.) before then, please call [phone].  If you test positive for Covid 19 in the 2 weeks post procedure, please call and report this information to [place].    If any biopsies were taken you will be contacted by phone or by letter within the next 1-3 weeks.  Please call us at [phone] if you have not heard about the biopsies in 3 weeks.    SIGNATURES/CONFIDENTIALITY: You and/or your care partner have signed paperwork which will be entered into your electronic medical record.  These signatures attest to the fact that that the information above on your After Visit Summary has been reviewed and is understood.  Full responsibility of the  confidentiality of this discharge information lies with you and/or your care-partner. 

## 2020-08-01 NOTE — Progress Notes (Signed)
Patient was scheduled for both EGD and colonoscopy.  However, he did not drink all of the bowel preparation and is not having clear stools, therefore the colonoscopy portion will be rescheduled.  Can still proceed with EGD as scheduled today.

## 2020-08-01 NOTE — Progress Notes (Signed)
Report to PACU, RN, vss, BBS= Clear.  

## 2020-08-03 ENCOUNTER — Telehealth: Payer: Self-pay

## 2020-08-03 NOTE — Telephone Encounter (Signed)
  Follow up Call-  Call back number 08/01/2020  Post procedure Call Back phone  # [phone]  Permission to leave phone message Yes  Some recent data might be hidden     Patient questions:  Do you have a fever, pain , or abdominal swelling? No. Pain Score  0 *  Have you tolerated food without any problems? Yes.    Have you been able to return to your normal activities? Yes.    Do you have any questions about your discharge instructions: Diet   No. Medications  No. Follow up visit  No.  Do you have questions or concerns about your Care? no  Actions: * If pain score is 4 or above: 1. No action needed, pain <4.Have you developed a fever since your procedure? no  2.   Have you had an respiratory symptoms (SOB or cough) since your procedure? no  3.   Have you tested positive for COVID 19 since your procedure no  4.   Have you had any family members/close contacts diagnosed with the COVID 19 since your procedure?  no   If yes to any of these questions please route to Joylene John, RN and Joella Prince, RN

## 2020-08-08 ENCOUNTER — Ambulatory Visit (AMBULATORY_SURGERY_CENTER): Payer: Self-pay | Admitting: *Deleted

## 2020-08-08 ENCOUNTER — Other Ambulatory Visit: Payer: Self-pay

## 2020-08-08 VITALS — Ht 64.0 in | Wt 160.0 lb

## 2020-08-08 DIAGNOSIS — K625 Hemorrhage of anus and rectum: Secondary | ICD-10-CM

## 2020-08-08 NOTE — Progress Notes (Signed)
Patient, family member and interpreter is here in-person for PV. Patient denies any allergies to eggs or soy. Patient denies any problems with anesthesia/sedation. Patient denies any oxygen use at home. Patient denies taking any diet/weight loss medications or blood thinners. Patient is not being treated for MRSA or C-diff. Patient denies any medical changes since last GI visit except he has a "pimple on anus", instructed him to let the GI dr know if it's still present at time of procedure. Patient is aware of our care-partner policy and [id number] safety protocol. Patient is fully COVID-19 vaccinated, per patient.

## 2020-08-10 ENCOUNTER — Encounter: Payer: Self-pay | Admitting: Gastroenterology

## 2020-08-23 ENCOUNTER — Encounter: Payer: Self-pay | Admitting: Certified Registered Nurse Anesthetist

## 2020-08-24 ENCOUNTER — Other Ambulatory Visit: Payer: Self-pay

## 2020-08-24 ENCOUNTER — Ambulatory Visit (AMBULATORY_SURGERY_CENTER): Payer: Self-pay | Admitting: Gastroenterology

## 2020-08-24 ENCOUNTER — Encounter: Payer: Self-pay | Admitting: Gastroenterology

## 2020-08-24 VITALS — BP 91/57 | HR 56 | Temp 96.4°F | Resp 13 | Ht 64.0 in | Wt 160.0 lb

## 2020-08-24 DIAGNOSIS — K641 Second degree hemorrhoids: Secondary | ICD-10-CM

## 2020-08-24 DIAGNOSIS — K625 Hemorrhage of anus and rectum: Secondary | ICD-10-CM

## 2020-08-24 DIAGNOSIS — D123 Benign neoplasm of transverse colon: Secondary | ICD-10-CM

## 2020-08-24 DIAGNOSIS — K5730 Diverticulosis of large intestine without perforation or abscess without bleeding: Secondary | ICD-10-CM

## 2020-08-24 DIAGNOSIS — D122 Benign neoplasm of ascending colon: Secondary | ICD-10-CM

## 2020-08-24 MED ORDER — SODIUM CHLORIDE 0.9 % IV SOLN: 500.0000 mL | Freq: Once | INTRAVENOUS | Status: DC

## 2020-08-24 NOTE — Patient Instructions (Signed)
YOU HAD AN ENDOSCOPIC PROCEDURE TODAY AT [facility]:   Refer to the procedure report that was given to you for any specific questions about what was found during the examination.  If the procedure report does not answer your questions, please call your gastroenterologist to clarify.  If you requested that your care partner not be given the details of your procedure findings, then the procedure report has been included in a sealed envelope for you to review at your convenience later.  **Handout given on polyps, diverticulosis, hemorrhoids and hemorrhoid banding**   YOU SHOULD EXPECT: Some feelings of bloating in the abdomen. Passage of more gas than usual.  Walking can help get rid of the air that was put into your GI tract during the procedure and reduce the bloating. If you had a lower endoscopy (such as a colonoscopy or flexible sigmoidoscopy) you may notice spotting of blood in your stool or on the toilet paper. If you underwent a bowel prep for your procedure, you may not have a normal bowel movement for a few days.  Please Note:  You might notice some irritation and congestion in your nose or some drainage.  This is from the oxygen used during your procedure.  There is no need for concern and it should clear up in a day or so.  SYMPTOMS TO REPORT IMMEDIATELY:   Following lower endoscopy (colonoscopy or flexible sigmoidoscopy):  Excessive amounts of blood in the stool  Significant tenderness or worsening of abdominal pains  Swelling of the abdomen that is new, acute  Fever of 100F or higher    For urgent or emergent issues, a gastroenterologist can be reached at any hour by calling [phone]. Do not use MyChart messaging for urgent concerns.    DIET:  We do recommend a small meal at first, but then you may proceed to your regular diet.  Drink plenty of fluids but you should avoid alcoholic beverages for 24 hours.  ACTIVITY:  You should plan to take it easy for  the rest of today and you should NOT DRIVE or use heavy machinery until tomorrow (because of the sedation medicines used during the test).    FOLLOW UP: Our staff will call the number listed on your records 48-72 hours following your procedure to check on you and address any questions or concerns that you may have regarding the information given to you following your procedure. If we do not reach you, we will leave a message.  We will attempt to reach you two times.  During this call, we will ask if you have developed any symptoms of COVID 19. If you develop any symptoms (ie: fever, flu-like symptoms, shortness of breath, cough etc.) before then, please call [phone].  If you test positive for Covid 19 in the 2 weeks post procedure, please call and report this information to [place].    If any biopsies were taken you will be contacted by phone or by letter within the next 1-3 weeks.  Please call us at [phone] if you have not heard about the biopsies in 3 weeks.    SIGNATURES/CONFIDENTIALITY: You and/or your care partner have signed paperwork which will be entered into your electronic medical record.  These signatures attest to the fact that that the information above on your After Visit Summary has been reviewed and is understood.  Full responsibility of the confidentiality of this discharge information lies with you and/or your care-partner.

## 2020-08-24 NOTE — Progress Notes (Signed)
Pt's states no medical or surgical changes since previsit or office visit.  [place] - vitals 

## 2020-08-24 NOTE — Op Note (Signed)
[facility] Patient Name: Travis Oconnell Procedure Date: 08/24/2020 2:35 PM MRN: [id number] Endoscopist: Gerrit Heck , MD Age: 60 Referring MD:  Date of Birth: 24-Aug-1960 Gender: Male Account #: [id number] Procedure:                Colonoscopy Indications:              60 yo male with recent episodic hematochezia. No                            previous colonoscopy. Medicines:                Monitored Anesthesia Care Procedure:                Pre-Anesthesia Assessment:                           - Prior to the procedure, a History and Physical                            was performed, and patient medications and                            allergies were reviewed. The patient's tolerance of                            previous anesthesia was also reviewed. The risks                            and benefits of the procedure and the sedation                            options and risks were discussed with the patient.                            All questions were answered, and informed consent                            was obtained. Prior Anticoagulants: The patient has                            taken no previous anticoagulant or antiplatelet                            agents. ASA Grade Assessment: II - A patient with                            mild systemic disease. After reviewing the risks                            and benefits, the patient was deemed in                            satisfactory condition to undergo the procedure.  After obtaining informed consent, the colonoscope                            was passed under direct vision. Throughout the                            procedure, the patient's blood pressure, pulse, and                            oxygen saturations were monitored continuously. The                            Olympus CF-HQ190 [id number]) Colonoscope was                            introduced through the anus and advanced to  the the                            terminal ileum. The colonoscopy was performed                            without difficulty. The patient tolerated the                            procedure well. The quality of the bowel                            preparation was good. The terminal ileum, ileocecal                            valve, appendiceal orifice, and rectum were                            photographed. Scope In: 2:42:41 PM Scope Out: 3:04:40 PM Scope Withdrawal Time: 0 hours 19 minutes 5 seconds  Total Procedure Duration: 0 hours 21 minutes 59 seconds  Findings:                 Hemorrhoids were found on perianal exam.                           Two sessile polyps were found in the transverse                            colon and ascending colon. The polyps were 3 to 4                            mm in size. These polyps were removed with a cold                            snare. Resection and retrieval were complete.                            Estimated blood loss was minimal.  A few small and large-mouthed diverticula were                            found in the sigmoid colon and ascending colon.                           Non-bleeding internal hemorrhoids were found during                            retroflexion. The hemorrhoids were small and Grade                            II (internal hemorrhoids that prolapse but reduce                            spontaneously).                           An area of mildly edematous mucosa was found at the                            ileocecal valve. Biopsies were taken with a cold                            forceps for histology. Estimated blood loss was                            minimal.                           The terminal ileum appeared normal. Complications:            No immediate complications. Estimated Blood Loss:     Estimated blood loss was minimal. Impression:               - Hemorrhoids found on perianal  exam.                           - Two 3 to 4 mm polyps in the transverse colon and                            in the ascending colon, removed with a cold snare.                            Resected and retrieved.                           - Diverticulosis in the sigmoid colon and in the                            ascending colon.                           - Non-bleeding internal hemorrhoids.                           -  Congested mucosa at the ileocecal valve. Biopsied.                           - The examined portion of the ileum was normal. Recommendation:           - Patient has a contact number available for                            emergencies. The signs and symptoms of potential                            delayed complications were discussed with the                            patient. Return to normal activities tomorrow.                            Written discharge instructions were provided to the                            patient.                           - Resume previous diet.                           - Continue present medications.                           - Await pathology results.                           - Repeat colonoscopy for surveillance based on                            pathology results.                           - Use fiber, for example Citrucel, Fibercon, Konsyl                            or Metamucil.                           - Internal hemorrhoids were noted on this study and                            may be amenable to hemorrhoid band ligation. If you                            are interested in further treatment of these                            hemorrhoids with band ligation, please contact my  clinic to set up an appointment for evaluation and                            treatment. Gerrit Heck, MD 08/24/2020 3:10:22 PM

## 2020-08-24 NOTE — Progress Notes (Unsigned)
Called to room to assist during endoscopic procedure.  Patient ID and intended procedure confirmed with present staff. Received instructions for my participation in the procedure from the performing physician.  

## 2020-08-26 ENCOUNTER — Telehealth: Payer: Self-pay | Admitting: *Deleted

## 2020-08-26 ENCOUNTER — Telehealth: Payer: Self-pay

## 2020-08-26 NOTE — Telephone Encounter (Signed)
No answer for post procedure call back. Unable to leave message mailbox was full.

## 2020-08-26 NOTE — Telephone Encounter (Signed)
Voicemail is full - unable to leave message on follow up call.

## 2020-09-02 ENCOUNTER — Encounter: Payer: Self-pay | Admitting: Gastroenterology

## 2022-05-03 ENCOUNTER — Emergency Department (HOSPITAL_COMMUNITY): Payer: Self-pay

## 2022-05-03 ENCOUNTER — Emergency Department (HOSPITAL_COMMUNITY)
Admission: EM | Admit: 2022-05-03 | Discharge: 2022-05-04 | Disposition: A | Discharge status: Home / Self Care | Payer: Self-pay | Attending: Emergency Medicine | Admitting: Emergency Medicine

## 2022-05-03 ENCOUNTER — Other Ambulatory Visit: Payer: Self-pay

## 2022-05-03 ENCOUNTER — Encounter (HOSPITAL_COMMUNITY): Payer: Self-pay

## 2022-05-03 DIAGNOSIS — R410 Disorientation, unspecified: Secondary | ICD-10-CM | POA: Insufficient documentation

## 2022-05-03 DIAGNOSIS — N289 Disorder of kidney and ureter, unspecified: Secondary | ICD-10-CM | POA: Insufficient documentation

## 2022-05-03 DIAGNOSIS — J189 Pneumonia, unspecified organism: Secondary | ICD-10-CM

## 2022-05-03 DIAGNOSIS — N281 Cyst of kidney, acquired: Secondary | ICD-10-CM

## 2022-05-03 DIAGNOSIS — K2900 Acute gastritis without bleeding: Secondary | ICD-10-CM | POA: Insufficient documentation

## 2022-05-03 NOTE — ED Triage Notes (Signed)
Pt reports chest pain, dizziness, headache, and SOB at night ongoing intermittently x 2 weeks. He states his headache worsened today.

## 2022-05-03 NOTE — ED Provider Triage Note (Signed)
Emergency Medicine Provider Triage Evaluation Note  Travis Oconnell , a 61 y.o. male  was evaluated in triage.  Pt complains of headache and chest pain. States that both symptoms have been intermittent for the past several months. States his chest pain feels like heartburn and his headache is frontal in nature. He has been treated for heartburn previously. States that neither symptom is worse than normal for him. This headache started a few days ago. Denies vision changes, dizziness, or lightheadedness. No nausea, vomiting, ro diarrhea. No shortness of breath  Review of Systems  Positive:  Negative:   Physical Exam  BP 119/81   Pulse 77   Temp 98.4 F (36.9 C) (Oral)   Resp 18   SpO2 99%  Gen:   Awake, no distress   Resp:  Normal effort  MSK:   Moves extremities without difficulty  Other:  Alert and oriented and neurologically intact without focal deficits  Medical Decision Making  Medically screening exam initiated at 11:27 PM.  Appropriate orders placed.  Travis Oconnell was informed that the remainder of the evaluation will be completed by another provider, this initial triage assessment does not replace that evaluation, and the importance of remaining in the ED until their evaluation is complete.     Bud Face, PA-C 05/03/22 2329

## 2022-05-04 ENCOUNTER — Emergency Department (HOSPITAL_COMMUNITY): Payer: Self-pay

## 2022-05-04 LAB — TROPONIN I (HIGH SENSITIVITY)
Troponin I (High Sensitivity): <2 ng/L (ref <18)
Troponin I (High Sensitivity): <2 ng/L (ref <18)

## 2022-05-04 LAB — CBC
HCT: 42.9 % (ref 39.0–52.0)
Hemoglobin: 13.8 g/dL (ref 13.0–17.0)
MCH: 27.2 pg (ref 26.0–34.0)
MCHC: 32.2 g/dL (ref 30.0–36.0)
MCV: 84.6 fL (ref 80.0–100.0)
Platelets: 195 10*3/uL (ref 150–400)
RBC: 5.07 MIL/uL (ref 4.22–5.81)
RDW: 13.6 % (ref 11.5–15.5)
WBC: 4.2 10*3/uL (ref 4.0–10.5)
nRBC: 0 % (ref 0.0–0.2)

## 2022-05-04 LAB — HEPATIC FUNCTION PANEL
ALT: 15 U/L (ref 0–44)
AST: 21 U/L (ref 15–41)
Albumin: 4.5 g/dL (ref 3.5–5.0)
Alkaline Phosphatase: 58 U/L (ref 38–126)
Bilirubin, Direct: 0.1 mg/dL (ref 0.0–0.2)
Indirect Bilirubin: 0.5 mg/dL (ref 0.3–0.9)
Total Bilirubin: 0.6 mg/dL (ref 0.3–1.2)
Total Protein: 7.9 g/dL (ref 6.5–8.1)

## 2022-05-04 LAB — LIPASE, BLOOD: Lipase: 28 U/L (ref 11–51)

## 2022-05-04 LAB — BASIC METABOLIC PANEL
Anion gap: 10 (ref 5–15)
BUN: 16 mg/dL (ref 8–23)
CO2: 27 mmol/L (ref 22–32)
Calcium: 9.6 mg/dL (ref 8.9–10.3)
Chloride: 102 mmol/L (ref 98–111)
Creatinine, Ser: 1.03 mg/dL (ref 0.61–1.24)
GFR, Estimated: >60 mL/min (ref >60)
Glucose, Bld: 107 mg/dL — ABNORMAL HIGH (ref 70–99)
Potassium: 4 mmol/L (ref 3.5–5.1)
Sodium: 139 mmol/L (ref 135–145)

## 2022-05-04 MED ORDER — AZITHROMYCIN 250 MG PO TABS: 250.0000 mg | ORAL_TABLET | Freq: Every day | ORAL | 0 refills | Status: DC

## 2022-05-04 MED ORDER — GADOBUTROL 1 MMOL/ML IV SOLN
7.0000 mL | Freq: Once | INTRAVENOUS | Status: AC | PRN
  Administered 2022-05-04: 7 mL via INTRAVENOUS

## 2022-05-04 MED ORDER — AMOXICILLIN-POT CLAVULANATE 875-125 MG PO TABS: 1.0000 | ORAL_TABLET | Freq: Two times a day (BID) | ORAL | 0 refills | Status: DC

## 2022-05-04 MED ORDER — LIDOCAINE VISCOUS HCL 2 % MT SOLN
15.0000 mL | Freq: Once | OROMUCOSAL | Status: AC
  Administered 2022-05-04: 15 mL via ORAL
  Filled 2022-05-04: qty 15

## 2022-05-04 MED ORDER — ALUM & MAG HYDROXIDE-SIMETH 200-200-20 MG/5ML PO SUSP
30.0000 mL | Freq: Once | ORAL | Status: AC
  Administered 2022-05-04: 30 mL via ORAL
  Filled 2022-05-04: qty 30

## 2022-05-04 NOTE — ED Notes (Signed)
Pt c/o some headache mild  the pts son speaks for him he does not speak english

## 2022-05-04 NOTE — ED Notes (Signed)
Pt went to [facility]

## 2022-05-04 NOTE — ED Provider Notes (Signed)
Signout received on this 61 year old male who presented with epigastric abdominal pain.  Concern is he may have gastritis.  He was diagnosed with this 2 days ago and prescribed Pepcid and Carafate.  He has been taking significant amounts of Aleve for headaches that has not been going away started 2 weeks ago.  Currently he is without a headache.  Ultrasound showed concern for renal cell carcinoma.  Currently patient is pending MRI brain, MRI abdomen pelvis. Physical Exam  BP 120/79   Pulse 64   Temp 98.8 F (37.1 C)   Resp 16   Ht '5\' 5"'$  (1.651 m)   Wt 72.6 kg   SpO2 99%   BMI 26.63 kg/m     Procedures  Procedures  ED Course / MDM   Clinical Course as of 05/04/22 1523  Fri May 04, 2022  2956 Basic metabolic panel(!) [OZ]    Clinical Course User Index [OZ] Luvenia Heller, PA-C   Medical Decision Making Amount and/or Complexity of Data Reviewed Labs: ordered. Decision-making details documented in ED Course. Radiology: ordered.  Risk OTC drugs. Prescription drug management.   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  This is relatively benign and has a 0-6% chance of becoming malignant.  I had a conversation regarding this with the patient and his son at bedside.  Discussed establishing primary care provider and monitoring of this.  Brain MRI is normal.  CT chest did show evidence of potential pneumonia.  Antibiotics prescribed.  Discussed pulmonary nodule and PCP follow-up for ongoing monitoring.  Patient and son voiced understanding and are in agreement with plan.       Evlyn Courier, PA-C 05/04/22 2134    Leanord Asal K, DO 05/04/22 2235

## 2022-05-04 NOTE — ED Provider Notes (Signed)
 [facility] EMERGENCY DEPARTMENT Provider Note   CSN: [id number] Arrival date & time: 05/03/22  2308     History Chief Complaint  Patient presents with   Chest Pain    Travis Oconnell is a 61 y.o. male.  Chest Pain Associated symptoms: abdominal pain   Associated symptoms: no cough, no diaphoresis, no dysphagia, no fatigue, no fever, no nausea, no palpitations, no shortness of breath and no vomiting   Interpretation provided by: Patient reports 2 week history of epigastric tenderness. He states that he was recently seen in urgent care 1 day ago due to same complaint as well as persistent headaches that have not resolved in last 2 weeks. He was prescribed pantoprazole, tramadol, and sucralfate and states that he was previously taking Aleve to address headaches. Patient describes sensation of chest pain as primarily epigastric and associated with reflux symptoms but states that pain at times moves to other parts of his abdomen. Denies shortness of breath, altered mental status, nausea, vomiting, or rashes. No alcohol, tobacco, IVDU, or other substance use.     Home Medications Prior to Admission medications   Medication Sig Start Date End Date Taking? Authorizing Provider  omeprazole (PRILOSEC) 40 MG capsule Take 1 capsule (40 mg total) by mouth daily. 07/25/20   Esterwood, Amy S, PA-C      Allergies    Patient has no known allergies.    Review of Systems   Review of Systems  Constitutional:  Negative for diaphoresis, fatigue and fever.  HENT:  Negative for drooling, sore throat and trouble swallowing.   Respiratory:  Negative for cough, chest tightness and shortness of breath.   Cardiovascular:  Positive for chest pain. Negative for palpitations and leg swelling.  Gastrointestinal:  Positive for abdominal pain and diarrhea. Negative for blood in stool, constipation, nausea and vomiting.  Genitourinary:  Negative for dysuria, flank pain, hematuria and urgency.   All other systems reviewed and are negative.   Physical Exam Updated Vital Signs BP 120/79   Pulse 64   Temp 98.8 F (37.1 C)   Resp 16   Ht 5\' 5"  (1.651 m)   Wt 72.6 kg   SpO2 99%   BMI 26.63 kg/m  Physical Exam Vitals reviewed.  Constitutional:      General: He is not in acute distress.    Appearance: He is normal weight. He is not ill-appearing or toxic-appearing.  HENT:     Head: Normocephalic and atraumatic.  Eyes:     Extraocular Movements: Extraocular movements intact.  Cardiovascular:     Rate and Rhythm: Normal rate and regular rhythm.     Heart sounds: Normal heart sounds. Heart sounds not distant. No murmur heard. Pulmonary:     Effort: Pulmonary effort is normal. No tachypnea or respiratory distress.  Abdominal:     General: Bowel sounds are normal.     Palpations: Abdomen is soft. There is no hepatomegaly or splenomegaly.     Tenderness: There is abdominal tenderness. There is guarding. There is no rebound.  Musculoskeletal:        General: Normal range of motion.     Cervical back: Normal range of motion and neck supple.  Skin:    General: Skin is warm and dry.     Capillary Refill: Capillary refill takes less than 2 seconds.  Neurological:     General: No focal deficit present.     Mental Status: He is alert. He is disoriented.     ED  Results / Procedures / Treatments   Labs (all labs ordered are listed, but only abnormal results are displayed) Labs Reviewed  BASIC METABOLIC PANEL - Abnormal; Notable for the following components:      Result Value   Glucose, Bld 107 (*)    All other components within normal limits  CBC  LIPASE, BLOOD  HEPATIC FUNCTION PANEL  TROPONIN I (HIGH SENSITIVITY)  TROPONIN I (HIGH SENSITIVITY)    EKG EKG Interpretation  Date/Time:  Thursday May 03 2022 23:10:30 EST Ventricular Rate:  77 PR Interval:  138 QRS Duration: 76 QT Interval:  356 QTC Calculation: 402 R Axis:   52 Text Interpretation: Normal  sinus rhythm Normal ECG No previous ECGs available no prior ECG for comparison. No STEMI Confirmed by Theda Belfast ([id number]) on 05/04/2022 8:40:06 AM  Radiology CT Chest Wo Contrast  Result Date: 05/04/2022 CLINICAL DATA:  Pneumonia, complication suspected, xray done EXAM: CT CHEST WITHOUT CONTRAST TECHNIQUE: Multidetector CT imaging of the chest was performed following the standard protocol without IV contrast. RADIATION DOSE REDUCTION: This exam was performed according to the departmental dose-optimization program which includes automated exposure control, adjustment of the mA and/or kV according to patient size and/or use of iterative reconstruction technique. COMPARISON:  May 03, 2022 FINDINGS: Cardiovascular: No significant vascular findings. Upper limits of normal heart size. No pericardial effusion. Mediastinum/Nodes: No axillary or mediastinal adenopathy. Thyroid is unremarkable. Lungs/Pleura: No pleural effusion or pneumothorax. No suspicious pulmonary nodule to correspond to chest radiograph finding. There are few scattered 2-3 mm pulmonary nodules with representative 3 mm pulmonary nodule the RIGHT upper lobe (series 4, image 43) and 3 mm LEFT lower lobe pulmonary nodules (series 4, image 68). Mild interlobular septal thickening Upper Abdomen: No acute abnormality.  Esophagus is mildly patulous. Musculoskeletal: No chest wall mass or suspicious bone lesions identified. IMPRESSION: 1. No suspicious pulmonary nodule to correspond to chest radiograph finding. 2. Mild interlobular septal thickening could reflect mild pulmonary edema or atypical infection. 3. There are few scattered 2-3 mm pulmonary nodules. Per Fleischner Society Guidelines, if patient is low risk for malignancy, no routine follow-up imaging is recommended. If patient is high risk for malignancy, a non-contrast Chest CT at 12 months is optional. If performed and the nodule is stable at 12 months, no further follow-up is  recommended. These guidelines do not apply to immunocompromised patients and patients with cancer. Follow up in patients with significant comorbidities as clinically warranted. For lung cancer screening, adhere to Lung-RADS guidelines. Reference: Radiology. 2017; 284(1):228-43. Electronically Signed   By: Meda Klinefelter M.D.   On: 05/04/2022 10:24   US Abdomen Limited RUQ (LIVER/GB)  Result Date: 05/04/2022 CLINICAL DATA:  Right upper quadrant pain EXAM: ULTRASOUND ABDOMEN LIMITED RIGHT UPPER QUADRANT COMPARISON:  None Available. FINDINGS: Gallbladder: No gallstones or wall thickening visualized. No sonographic Murphy sign noted by sonographer. Common bile duct: Diameter: 0.4 cm, which is normal Liver: No focal lesion identified. Within normal limits in parenchymal echogenicity. Portal vein is patent on color Doppler imaging with normal direction of blood flow towards the liver. Other: In the imaged portions of the kidney there is a 3.0 x 3.5 x 2.8 cm predominantly anechoic rounded lesion with asymmetric wall thickening along the periphery and increased flow on color doppler evaluation. IMPRESSION: 1. No sonographic evidence of cholelithiasis or acute cholecystitis. 2. Indeterminate 3.5 cm lesion in the right kidney with asymmetric wall thickening and increased flow on color Doppler evaluation. Recommend dedicated renal protocol CT or MRI for  further evaluation. Electronically Signed   By: Lorenza Cambridge M.D.   On: 05/04/2022 10:21   CT Head Wo Contrast  Result Date: 05/04/2022 CLINICAL DATA:  Headache, new onset (Age >= 51y). headache and chest pain. States that both symptoms have been intermittent for the past several months. States his chest pain feels like heartburn and his headache is frontal in nature EXAM: CT HEAD WITHOUT CONTRAST TECHNIQUE: Contiguous axial images were obtained from the base of the skull through the vertex without intravenous contrast. RADIATION DOSE REDUCTION: This exam was  performed according to the departmental dose-optimization program which includes automated exposure control, adjustment of the mA and/or kV according to patient size and/or use of iterative reconstruction technique. COMPARISON:  None Available. FINDINGS: Brain: No evidence of large-territorial acute infarction. No parenchymal hemorrhage. No mass lesion. No extra-axial collection. No mass effect or midline shift. No hydrocephalus. Basilar cisterns are patent. Vascular: No hyperdense vessel. Skull: No acute fracture or focal lesion. Sinuses/Orbits: Benign appearing bubbly periosteal remodeling of the sphenoid sinus versus frothy secretions. Otherwise paranasal sinuses and mastoid air cells are clear. The orbits are unremarkable. Other: None. IMPRESSION: No acute intracranial abnormality. Electronically Signed   By: Tish Frederickson M.D.   On: 05/04/2022 01:21   DG Chest 2 View  Result Date: 05/03/2022 CLINICAL DATA:  Chest pain EXAM: CHEST - 2 VIEW COMPARISON:  03/07/2016 FINDINGS: Vague posterior opacity overlying the spine lateral view. No pleural effusion. Normal cardiac size. No pneumothorax. IMPRESSION: Vague opacity overlying the spine lateral view, possible pneumonia. If symptoms not consistent with pneumonia, suggest chest CT to exclude nodule. Electronically Signed   By: Jasmine Pang M.D.   On: 05/03/2022 23:54    Procedures Procedures    Medications Ordered in ED Medications  alum & mag hydroxide-simeth (MAALOX/MYLANTA) 200-200-20 MG/5ML suspension 30 mL (30 mLs Oral Given 05/04/22 0915)    And  lidocaine (XYLOCAINE) 2 % viscous mouth solution 15 mL (15 mLs Oral Given 05/04/22 0915)    ED Course/ Medical Decision Making/ A&P Clinical Course as of 05/04/22 1544  Fri May 04, 2022  1126 Basic metabolic panel(!) [OZ]    Clinical Course User Index [OZ] Smitty Knudsen, PA-C                           Medical Decision Making Isam is a 61 yo male with 2 week history of epigastric  tenderness. Patient reported recent onset of multiple headaches during this time for which he has taken multiple doses of Aleve to manage. Patient had been prescribed omeprazole and Carafate to manage abdominal tenderness at outside urgent care.  Reassessed patient with no change in status and remaining hemodynamically stable throughout ER stay while I met with him.  Plan is to discharge home pending MRIs to assess incidental finding on [place] of abdomen which showed a 3.5cm lesion on the right kidney. Consultation with radiologist indicated high suspicion for RCC.  Amount and/or Complexity of Data Reviewed Labs: ordered. Decision-making details documented in ED Course. Radiology: ordered.    Details: CXR showed possible pneumonia. CT chest showed possible pulmonary edema or atypical infection. [place] RUQ had incidental finding of mass on right kidney, likely RCC per radiology consult.  Risk OTC drugs. Prescription drug management.   Final Clinical Impression(s) / ED Diagnoses Final diagnoses:  Acute gastritis, presence of bleeding unspecified, unspecified gastritis type    Rx / DC Orders ED Discharge Orders  None         Salomon Mast 05/04/22 1545    Tegeler, Canary Brim, MD 05/07/22 [phone]

## 2022-05-04 NOTE — Discharge Instructions (Addendum)
Your work-up today was overall reassuring.  Ultrasound showed concern of a mass on the kidney.  This was further evaluated by MRI which showed a benign cyst.  This will need to be monitored by your primary care provider to ensure does not evolve into cancer down the road.  MRI of your brain was normal.  CT scan of the chest did show potential pneumonia.  I have given antibiotics to cover for this.  No concerning cause of your headache.  No concerning cause of your chest pain.  No evidence of heart attack.  This is likely result of gastritis.  Ensure you take your medicines you are prescribed 2 days ago consistently.  Prior to your discharge you also reported that your headache was not present.  Your CT scan of the chest does show a small nodule.  This does not show significant concern for cancer at this time however this needs to be monitored by your primary care provider.  Since you do not have a primary care provider I have attached information for [facility] health internal medicine center.  Please give them a call Monday morning to schedule an appointment with them.  Continue taking your Prilosec and Carafate that you were prescribed 2 days ago.  For any concerning symptoms please return to the emergency room.

## 2022-05-25 ENCOUNTER — Encounter: Payer: Self-pay | Admitting: Student

## 2022-05-25 ENCOUNTER — Ambulatory Visit (INDEPENDENT_AMBULATORY_CARE_PROVIDER_SITE_OTHER): Payer: Self-pay | Admitting: Student

## 2022-05-25 VITALS — BP 122/67 | HR 74 | Ht 65.0 in | Wt 168.0 lb

## 2022-05-25 DIAGNOSIS — R7303 Prediabetes: Secondary | ICD-10-CM

## 2022-05-25 DIAGNOSIS — Z0000 Encounter for general adult medical examination without abnormal findings: Secondary | ICD-10-CM

## 2022-05-25 DIAGNOSIS — K219 Gastro-esophageal reflux disease without esophagitis: Secondary | ICD-10-CM

## 2022-05-25 LAB — POCT GLYCOSYLATED HEMOGLOBIN (HGB A1C): Hemoglobin A1C: 6.7 % — AB (ref 4.0–5.6)

## 2022-05-25 LAB — GLUCOSE, CAPILLARY: Glucose-Capillary: 98 mg/dL (ref 70–99)

## 2022-05-25 NOTE — Patient Instructions (Addendum)
Thank you so much for coming to the clinic today! We are going to check for diabetes today, I will give you a call when the results return. If you do feel that abdominal pain again for the reflux you can try things like Pepcid or Pepto-Bismol over the counter.     If you have any questions please feel free to the call the clinic at anytime at [phone]. It was a pleasure seeing you!  Best, Dr. Sanjuana Mae    Na gode sosai don zuwan asibitin yau! Yau za mu bincika ciwon sukari, zan yi muku waya idan sakamakon ya dawo. Idan kun sake jin ciwon ciki don sake dawowa za ku iya gwada abubuwa kamar Pepcid ko Pepto-Bismol a kan tebur.    [place] wasu tambayoyi don [place]?in [place] asibitin a kowane lokaci a [phone]. Abin farin ciki ne ganin ku!  Mafi kyau, Dr. Joycelyn Man

## 2022-05-26 NOTE — Progress Notes (Signed)
   CC: Establishment of Care  HPI:  Travis Oconnell is a 61 y.o. male living with a history stated below and presents today for establishing care. Please see problem based assessment and plan for additional details.  Past Medical History:  Diagnosis Date   GERD (gastroesophageal reflux disease)    H. pylori infection        Family History:  No relevant family history  Social History   Socioeconomic History   Marital status: Single    Spouse name: Not on file   Number of children: 2   Years of education: Not on file   Highest education level: Not on file  Occupational History   Occupation: self -employed  Tobacco Use   Smoking status: Never   Smokeless tobacco: Never  Vaping Use   Vaping Use: Never used  Substance and Sexual Activity   Alcohol use: Never   Drug use: Never   Sexual activity: Not on file  Other Topics Concern   Not on file  Social History Narrative   Not on file   Social Determinants of Health   Financial Resource Strain: Not on file  Food Insecurity: Not on file  Transportation Needs: Not on file  Physical Activity: Not on file  Stress: Not on file  Social Connections: Not on file  Intimate Partner Violence: Not on file    Review of Systems: ROS negative except for what is noted on the assessment and plan.  Vitals:   05/25/22 0904  BP: 122/67  Pulse: 74  SpO2: 96%  Weight: 168 lb (76.2 kg)  Height: '5\' 5"'$  (1.651 m)    Physical Exam: Constitutional: well-appearing male, in no acute distress Cardiovascular: regular rate and rhythm, no m/r/g Pulmonary/Chest: normal work of breathing on room air, lungs clear to auscultation bilaterally Abdominal: soft, non-tender, non-distended MSK: normal bulk and tone Neurological: alert & oriented x 3, 5/5 strength in bilateral upper and lower extremities, normal gait Psych: normal mood and affect  Assessment & Plan:   GERD (gastroesophageal reflux disease) Pt presents to establish care. He  has a prolonged history of GERD, and is currently asymptomatic. He was underwent full workup for abdominal pain in the emergency department when he had an episode recently, and was given Augmentin and Azithromycin with protonix. Since then, he has finished his antibiotic course and has had no episodes of pain. Do not feel like he needs to be on any gastric acid suppression therapy at the moment since he is asymptomatic, but will continue to monitor.   Plan:  - Continue to monitor  Healthcare maintenance Pt has no insurance, making it difficult to establish baseline labs. He has the orange card packet and has been instructed to fill out, and also I am placing a referral to our social worker for further help.   He also had multiple polyps removed last year during a colonoscopy, and requires follow up in 4 years.   Prediabetes Pt has not had A1c checked on file. Checked upon encounter, was 6.7, putting him in pre-diabetic range. Encouraged him to start exercise and diet restrictions, and will re-check in about six months for further evaluation.   He denies any polyuria, polydipsia, changes in vision.  Plan:  - Recheck in 6 months  Patient seen with Dr. Pilar Grammes, M.D. [facility], PGY-1 Phone: [phone] Date 05/26/2022 Time 5:57 PM

## 2022-05-26 NOTE — Assessment & Plan Note (Addendum)
Pt has no insurance, making it difficult to establish baseline labs. He has the orange card packet and has been instructed to fill out, and also I am placing a referral to our social worker for further help.   He also had multiple polyps removed last year during a colonoscopy, and requires follow up in 4 years.

## 2022-05-26 NOTE — Assessment & Plan Note (Signed)
Pt presents to establish care. He has a prolonged history of GERD, and is currently asymptomatic. He was underwent full workup for abdominal pain in the emergency department when he had an episode recently, and was given Augmentin and Azithromycin with protonix. Since then, he has finished his antibiotic course and has had no episodes of pain. Do not feel like he needs to be on any gastric acid suppression therapy at the moment since he is asymptomatic, but will continue to monitor.   Plan:  - Continue to monitor

## 2022-05-26 NOTE — Assessment & Plan Note (Signed)
Pt has not had A1c checked on file. Checked upon encounter, was 6.7, putting him in pre-diabetic range. Encouraged him to start exercise and diet restrictions, and will re-check in about six months for further evaluation.   He denies any polyuria, polydipsia, changes in vision.  Plan:  - Recheck in 6 months

## 2022-05-28 ENCOUNTER — Telehealth: Payer: Self-pay | Admitting: *Deleted

## 2022-05-28 NOTE — Progress Notes (Signed)
  Care Coordination   Note   05/28/2022 Name: [name] MRN: [id number] DOB: [birth year]  Travis Oconnell is a 61 y.o. year old male who sees Nooruddin, Marlene Lard, MD for primary care. I reached out to Rennis Chris by phone today to offer care coordination services.  Mr. Duesing was given information about Care Coordination services today including:   The Care Coordination services include support from the care team which includes your Nurse Coordinator, Clinical Social Worker, or Pharmacist.  The Care Coordination team is here to help remove barriers to the health concerns and goals most important to you. Care Coordination services are voluntary, and the patient may decline or stop services at any time by request to their care team member.   Care Coordination Consent Status: Patient agreed to services and verbal consent obtained.   Follow up plan:  Telephone appointment with care coordination team member scheduled for:  05/31/22  Encounter Outcome:  Pt. Scheduled  [place]  Direct Dial: [phone]

## 2022-05-31 ENCOUNTER — Ambulatory Visit: Payer: Self-pay | Admitting: Licensed Clinical Social Worker

## 2022-05-31 NOTE — Patient Outreach (Signed)
SW contacted interpreter line services and [place] interpreter was unavailable. SW unable to complete call. SW schedule an appointment for 12/18 at 3pm and requested patient to be schedule with next available SW due to [place] not being available.   Milus Height, Jonetta Osgood, [place]  Social Worker [facility] Care Management  [phone]

## 2022-06-13 NOTE — Progress Notes (Addendum)
Internal Medicine Clinic Attending  I saw and evaluated the patient.  I personally confirmed the key portions of the history and exam documented by Dr. Sanjuana Mae and I reviewed pertinent patient test results.  The assessment, diagnosis, and plan were formulated together and I agree with the documentation in the resident's note.   Of note, patient's hemoglobin A1c is 6.7% in the diabetic range not the prediabetic range.  Will need to be started on medication if no improvement on follow-up.

## 2022-06-18 HISTORY — PX: CATARACT EXTRACTION: SUR2

## 2022-08-24 ENCOUNTER — Encounter: Payer: Self-pay | Admitting: Student

## 2022-08-28 ENCOUNTER — Ambulatory Visit: Payer: Self-pay | Admitting: Internal Medicine

## 2022-08-28 ENCOUNTER — Encounter: Payer: Self-pay | Admitting: Internal Medicine

## 2022-08-28 VITALS — BP 108/64 | HR 68 | Resp 12 | Ht 63.25 in | Wt 150.0 lb

## 2022-08-28 DIAGNOSIS — E119 Type 2 diabetes mellitus without complications: Secondary | ICD-10-CM

## 2022-08-28 DIAGNOSIS — R12 Heartburn: Secondary | ICD-10-CM

## 2022-08-28 MED ORDER — METFORMIN HCL ER 500 MG PO TB24: ORAL_TABLET | ORAL | 11 refills | Status: DC

## 2022-08-28 NOTE — Progress Notes (Signed)
Subjective:    Patient ID: Travis Oconnell, male   DOB: [birth year], 62 y.o.   MRN: [id number]   HPI  Here to establish  Travis Oconnell, his brother interprets Housa/French.   Polyuria and frequency:  feels he completely empties a large amount of urine, but then 1 hour later, he needs to urinate again.  Drinks a lot of water, but not clear if he is thirsty a lot.  Has been told his blood glucose was high in past, but then went back 2 weeks ago and blood sugar was okay.  Has been going to another clinic on [address], a Dr. Ovid Curd?   He did have an A1C in December 2023 and measured in diabetic range at 6.7%.  Patient states he was unaware.   No knowledge of his cholesterol CMP save for blood glucose and CBC normal 04/2022.  Diet:    First oral intake of day:  Water, Black eyed peas.  Lunch:  water, black eyed peas, foufou with okra stew.    Dinner:  beans or foufou with stew.  Calisthenics in his room daily for 5 minutes.  He also is an Journalist, newspaper.  He could go for a walk daily for 30 to 60 minutes daily.   2.  Heartburn for more than 1 year.  Feels epigastric burning and radiates up into bilateral upper chest.  Notes this when he is hungry.  Worse when eating, but after about 10 minutes, feels better.   Had EGD 08/01/2020 without findings on gross exam.  Mild chronic gastritis on exam of biopsies.  Negative for H pylori.   Has been on multiple acid reducers without improvement.  Sucralfate, Famotidine, omeprazole, pantoprazole. No symptoms with exertion.   No onions, does eat tomatoes. No tobacco or alcohol,   Uses Aleve maybe 4 times in a month.  Uses Goody powders occasionally as well.     No outpatient medications have been marked as taking for the 08/28/22 encounter (Office Visit) with Julieanne Manson, MD.   No Known Allergies  Past Medical History:  Diagnosis Date   GERD (gastroesophageal reflux disease)    H. pylori infection    Past Surgical History:   Procedure Laterality Date   NO PAST SURGERIES     UPPER GASTROINTESTINAL ENDOSCOPY  08/01/2020   [place]    No family history on file.  Social History   Socioeconomic History   Marital status: Single    Spouse name: Not on file   Number of children: 2   Years of education: Not on file   Highest education level: Not on file  Occupational History   Occupation: self -employed  Tobacco Use   Smoking status: Never   Smokeless tobacco: Never  Vaping Use   Vaping status: Never Used  Substance and Sexual Activity   Alcohol use: Never   Drug use: Never   Sexual activity: Not on file  Other Topics Concern   Not on file  Social History Narrative   Not on file   Social Determinants of Health   Financial Resource Strain: Not on file  Food Insecurity: Not on file  Transportation Needs: Not on file  Physical Activity: Not on file  Stress: Not on file  Social Connections: Not on file  Intimate Partner Violence: Not on file      Review of Systems    Objective:   BP 108/64 (BP Location: Right Arm, Patient Position: Sitting, Cuff Size: Normal)   Pulse 68  Resp 12   Ht 5' 3.25" (1.607 m)   Wt 150 lb (68 kg)   BMI 26.36 kg/m   Physical Exam HENT:     Head: Normocephalic.     Right Ear: Tympanic membrane normal.     Left Ear: Tympanic membrane normal.     Nose: Nose normal.     Mouth/Throat:     Mouth: Mucous membranes are moist.     Pharynx: Oropharynx is clear.  Eyes:     Extraocular Movements: Extraocular movements intact.     Conjunctiva/sclera: Conjunctivae normal.     Pupils: Pupils are equal, round, and reactive to light.     Comments: Discs sharp  Neck:     Thyroid: No thyroid mass or thyromegaly.  Cardiovascular:     Rate and Rhythm: Normal rate and regular rhythm.     Heart sounds: S1 normal and S2 normal. No murmur heard.    No friction rub. No S3 or S4 sounds.     Comments: No carotid bruits.  Carotid, radial, femoral, DP and PT pulses normal  and equal.   Pulmonary:     Effort: Pulmonary effort is normal.     Breath sounds: Normal breath sounds and air entry.  Abdominal:     General: Bowel sounds are normal.     Palpations: Abdomen is soft. There is no hepatomegaly, splenomegaly or mass.     Tenderness: There is no abdominal tenderness.     Hernia: No hernia is present.  Musculoskeletal:     Right lower leg: No edema.     Left lower leg: No edema.  Skin:    General: Skin is warm.     Capillary Refill: Capillary refill takes less than 2 seconds.     Findings: No rash.  Neurological:     Mental Status: He is alert.    ECG from 04/2022 normal  Assessment & Plan    DM:  Metformin XR 500 mg twice daily with meals.  Follow up in 3 months for fasting labs:  FLP, A1C, Urine microalbumin/creat, CMP and with me 2d later.  Discussed additions to his otherwise relatively healthy diet.  Encouraged daily walk to work up to 60 minutes.  2.  Dyspepsia:  unimproved with multiple acid reducers and EGD without significant findings.  Discussed avoiding tomatoes and onions.  Elevate HOB.  Smaller meals and not to lie down for 2 hours after eating.  Avoid NSAIDS.

## 2022-08-28 NOTE — Patient Instructions (Addendum)
Drink a glass of water before every meal Drink 6-8 glasses of water daily Eat three meals daily Eat a protein and healthy fat with every meal (eggs,fish, chicken, [place] and limit red meats) Eat 5 servings of vegetables daily, mix the colors Eat 2 servings of fruit daily with skin, if skin is edible Use smaller plates Put food/utensils down as you chew and swallow each bite Eat at a table with friends/family at least once daily, no TV Do not eat in front of the TV  Recent studies show that people who consume all of their calories in a 12 hour period lose weight more efficiently.  For example, if you eat your first meal at 7:00 a.m., your last meal of the day should be completed by 7:00 p.m.  Eggs and fish not fried, roasted nuts, not salted, avocados, olives

## 2022-09-04 ENCOUNTER — Encounter: Payer: Self-pay | Admitting: Student

## 2022-12-04 ENCOUNTER — Other Ambulatory Visit: Payer: Self-pay

## 2022-12-10 ENCOUNTER — Ambulatory Visit: Payer: Self-pay | Admitting: Internal Medicine

## 2022-12-19 ENCOUNTER — Telehealth: Payer: Self-pay

## 2022-12-19 NOTE — Telephone Encounter (Signed)
Patient would like an appointment sooner than his appointments on September. Lab and OV appointment.   Will call when an appointment becomes available.

## 2023-03-01 ENCOUNTER — Other Ambulatory Visit: Payer: Self-pay

## 2023-03-01 DIAGNOSIS — E119 Type 2 diabetes mellitus without complications: Secondary | ICD-10-CM

## 2023-03-01 DIAGNOSIS — Z13220 Encounter for screening for lipoid disorders: Secondary | ICD-10-CM

## 2023-03-01 DIAGNOSIS — Z79899 Other long term (current) drug therapy: Secondary | ICD-10-CM

## 2023-03-02 LAB — COMPREHENSIVE METABOLIC PANEL
ALT: 14 IU/L (ref 0–44)
AST: 20 IU/L (ref 0–40)
Albumin: 4.1 g/dL (ref 3.9–4.9)
Alkaline Phosphatase: 68 IU/L (ref 44–121)
BUN/Creatinine Ratio: 12 (ref 10–24)
BUN: 11 mg/dL (ref 8–27)
Bilirubin Total: 0.6 mg/dL (ref 0.0–1.2)
CO2: 25 mmol/L (ref 20–29)
Calcium: 9.4 mg/dL (ref 8.6–10.2)
Chloride: 102 mmol/L (ref 96–106)
Creatinine, Ser: 0.94 mg/dL (ref 0.76–1.27)
Globulin, Total: 2.5 g/dL (ref 1.5–4.5)
Glucose: 98 mg/dL (ref 70–99)
Potassium: 4.1 mmol/L (ref 3.5–5.2)
Sodium: 141 mmol/L (ref 134–144)
Total Protein: 6.6 g/dL (ref 6.0–8.5)
eGFR: 91 mL/min/{1.73_m2} (ref >59)

## 2023-03-02 LAB — LIPID PANEL W/O CHOL/HDL RATIO
Cholesterol, Total: 255 mg/dL — ABNORMAL HIGH (ref 100–199)
HDL: 45 mg/dL (ref >39)
LDL Chol Calc (NIH): 187 mg/dL — ABNORMAL HIGH (ref 0–99)
Triglycerides: 125 mg/dL (ref 0–149)
VLDL Cholesterol Cal: 23 mg/dL (ref 5–40)

## 2023-03-02 LAB — HEMOGLOBIN A1C
Est. average glucose Bld gHb Est-mCnc: 134 mg/dL
Hgb A1c MFr Bld: 6.3 % — ABNORMAL HIGH (ref 4.8–5.6)

## 2023-03-02 LAB — MICROALBUMIN / CREATININE URINE RATIO
Creatinine, Urine: 118.4 mg/dL
Microalb/Creat Ratio: <3 mg/g{creat} (ref 0–29)
Microalbumin, Urine: <3 ug/mL

## 2023-03-04 DIAGNOSIS — E119 Type 2 diabetes mellitus without complications: Secondary | ICD-10-CM | POA: Insufficient documentation

## 2023-03-04 DIAGNOSIS — R12 Heartburn: Secondary | ICD-10-CM | POA: Insufficient documentation

## 2023-03-05 ENCOUNTER — Ambulatory Visit: Payer: Self-pay | Admitting: Internal Medicine

## 2023-03-05 NOTE — Telephone Encounter (Signed)
Patient 03/05/23 appointment had to be rescheduled, patient would like a sooner appt.  Patient is available any day at any time. Will call patient if there is a cancellation .

## 2023-03-25 ENCOUNTER — Encounter: Payer: Self-pay | Admitting: Internal Medicine

## 2023-03-25 ENCOUNTER — Ambulatory Visit: Payer: Self-pay | Admitting: Internal Medicine

## 2023-03-25 VITALS — BP 106/62 | HR 79 | Resp 16 | Ht 63.25 in | Wt 147.0 lb

## 2023-03-25 DIAGNOSIS — E119 Type 2 diabetes mellitus without complications: Secondary | ICD-10-CM

## 2023-03-25 DIAGNOSIS — B351 Tinea unguium: Secondary | ICD-10-CM

## 2023-03-25 DIAGNOSIS — Z23 Encounter for immunization: Secondary | ICD-10-CM

## 2023-03-25 DIAGNOSIS — B353 Tinea pedis: Secondary | ICD-10-CM

## 2023-03-25 DIAGNOSIS — E7800 Pure hypercholesterolemia, unspecified: Secondary | ICD-10-CM

## 2023-03-25 MED ORDER — TERBINAFINE HCL 250 MG PO TABS: ORAL_TABLET | ORAL | 2 refills | Status: DC

## 2023-03-25 MED ORDER — ATORVASTATIN CALCIUM 40 MG PO TABS: 40.0000 mg | ORAL_TABLET | Freq: Every day | ORAL | 11 refills | Status: DC

## 2023-03-25 NOTE — Patient Instructions (Addendum)
For your diet:  Drink a glass of water before every meal Drink 6-8 glasses of water daily Eat three meals daily Eat a protein and healthy fat with every meal (eggs,fish, chicken, [place] and limit red meats) Eat 5 servings of vegetables daily, mix the colors Eat 2 servings of fruit daily with skin, if skin is edible Use smaller plates Put food/utensils down as you chew and swallow each bite Eat at a table with friends/family at least once daily, no TV Do not eat in front of the TV  Recent studies show that people who consume all of their calories in a 12 hour period lose weight more efficiently.  For example, if you eat your first meal at 7:00 a.m., your last meal of the day should be completed by 7:00 p.m.  For your feet and toenails:    For foot and toenail fungus:  Spray your shoes with Lysol or Lotrimin Antifungal spray when you start the medication for your feet.   Re-spray your the shoes you wear with each wearing and allow to dry before wearing again You will need to continue to spray the shoes you have during the infection of your toenails and feet have been thrown out due to wear.  Once your toenails and feet are clear of infection, the shoes you buy new do not necessarily need to be sprayed. Clean your shower floor once to twice daily with bleach containing cleaner.

## 2023-03-25 NOTE — Telephone Encounter (Signed)
Patient has been scheduled

## 2023-03-25 NOTE — Progress Notes (Signed)
Subjective:    Patient ID: Travis Oconnell, male   DOB: [birth year], 62 y.o.   MRN: [id number]   HPI  Son interprets   DM:  A1C down to 6.3%.  Discussed that was at goal.  States had eye exam and in fact, had cataract extraction and lens implantation of right eye last week.  They do not know name of the ophthalmologist, but was in [facility].  Not in Care Everywhere tab.  Not clear if any diabetic eye changes.  Left cataract reportedly deemed to small to need removal.   Took Metformin ER until about 2 months ago and then stopped.  Not clear why.   Does check feet every night before goes to bed.  Feet appear normal.  Discussed kidney and liver function are fine.    2.  Hypercholesterolemia:   Lipid Panel     Component Value Date/Time   CHOL 255 (H) 03/01/2023 0846   TRIG 125 03/01/2023 0846   HDL 45 03/01/2023 0846   LDLCALC 187 (H) 03/01/2023 0846   LABVLDL 23 03/01/2023 0846   3.  HM:  has not had more than the original 2 COVID vaccines:  [employer].  Has not had influenza vaccine this fall as well.  No pneumococcal, tetanus, or shingles vaccines as well.    No outpatient medications have been marked as taking for the 03/25/23 encounter (Office Visit) with Julieanne Manson, MD.   No Known Allergies   Review of Systems    Objective:   BP 106/62 (BP Location: Left Arm, Patient Position: Sitting, Cuff Size: Normal)   Pulse 79   Resp 16   Ht 5' 3.25" (1.607 m)   Wt 147 lb (66.7 kg)   BMI 25.83 kg/m   Physical Exam NAD HEENT:  right eye watering.  PERRL, EOMI Neck:  Supple, No adenopathy, no thyromegaly Chest:  CTA CV  RRR with normal S1 and S2, No S3, S4 or murmur.  No carotid bruits.  Carotid, radial and DP pulses normal and equal LE:  No edema.  Diabetic Foot Exam - Simple   Simple Foot Form Diabetic Foot exam was performed with the following findings: Yes 03/25/2023 12:08 PM  Visual Inspection See comments: Yes Sensation Testing Intact to touch and  monofilament testing bilaterally: Yes Pulse Check Posterior Tibialis and Dorsalis pulse intact bilaterally: Yes Comments Flaking mainly of plantar feet, Right>>Left Left great toenail thickened and discolored as are both 5th toenails.  Has crumbling and thickening of all nails of toes.     Assessment & Plan   DM:  better control on Metformin.  Discussed treatment vs cure and need to continue on the Metformin.  Card with contact info so can get records from ophthalmology sent .    2.  Hypercholesterolemia:  start Atorvastatin 40 mg daily.  FLP and CMP in 2 months  3.  Toenail onychomycosis and tinea pedis:  shoe/footcare and shower floor cleaning discussed.  Terbinafine 250 mg daily for 90 days.  Follow up with CMP in 2 months and with me in 3 months.  4.  HM:  HIV and Hep C screening with next lab draw.  Influenza and Spikevax COVID vaccines today.  When in for labs:  pneumococcal 20 and Shingrix.  3 months for Tdap.

## 2023-05-30 ENCOUNTER — Other Ambulatory Visit: Payer: Self-pay

## 2023-05-30 DIAGNOSIS — Z79899 Other long term (current) drug therapy: Secondary | ICD-10-CM

## 2023-05-30 DIAGNOSIS — E7800 Pure hypercholesterolemia, unspecified: Secondary | ICD-10-CM

## 2023-05-30 DIAGNOSIS — Z114 Encounter for screening for human immunodeficiency virus [HIV]: Secondary | ICD-10-CM

## 2023-05-30 DIAGNOSIS — Z1159 Encounter for screening for other viral diseases: Secondary | ICD-10-CM

## 2023-05-30 DIAGNOSIS — Z23 Encounter for immunization: Secondary | ICD-10-CM

## 2023-05-30 DIAGNOSIS — Z125 Encounter for screening for malignant neoplasm of prostate: Secondary | ICD-10-CM

## 2023-05-31 LAB — LIPID PANEL W/O CHOL/HDL RATIO
Cholesterol, Total: 212 mg/dL — ABNORMAL HIGH (ref 100–199)
HDL: 48 mg/dL (ref >39)
LDL Chol Calc (NIH): 151 mg/dL — ABNORMAL HIGH (ref 0–99)
Triglycerides: 74 mg/dL (ref 0–149)
VLDL Cholesterol Cal: 13 mg/dL (ref 5–40)

## 2023-05-31 LAB — COMPREHENSIVE METABOLIC PANEL
ALT: 14 [IU]/L (ref 0–44)
AST: 22 [IU]/L (ref 0–40)
Albumin: 4.2 g/dL (ref 3.9–4.9)
Alkaline Phosphatase: 64 [IU]/L (ref 44–121)
BUN/Creatinine Ratio: 15 (ref 10–24)
BUN: 15 mg/dL (ref 8–27)
Bilirubin Total: 0.6 mg/dL (ref 0.0–1.2)
CO2: 26 mmol/L (ref 20–29)
Calcium: 9.5 mg/dL (ref 8.6–10.2)
Chloride: 103 mmol/L (ref 96–106)
Creatinine, Ser: 0.99 mg/dL (ref 0.76–1.27)
Globulin, Total: 2.2 g/dL (ref 1.5–4.5)
Glucose: 91 mg/dL (ref 70–99)
Potassium: 4 mmol/L (ref 3.5–5.2)
Sodium: 142 mmol/L (ref 134–144)
Total Protein: 6.4 g/dL (ref 6.0–8.5)
eGFR: 85 mL/min/{1.73_m2} (ref >59)

## 2023-05-31 LAB — HIV ANTIBODY (ROUTINE TESTING W REFLEX): HIV Screen 4th Generation wRfx: NONREACTIVE

## 2023-05-31 LAB — HEPATITIS C ANTIBODY: Hep C Virus Ab: NONREACTIVE

## 2023-05-31 LAB — PSA: Prostate Specific Ag, Serum: 0.6 ng/mL (ref 0.0–4.0)

## 2023-06-25 ENCOUNTER — Encounter: Payer: Self-pay | Admitting: Internal Medicine

## 2023-06-25 ENCOUNTER — Ambulatory Visit: Payer: Self-pay | Admitting: Internal Medicine

## 2023-06-25 VITALS — BP 92/54 | HR 72 | Resp 20 | Ht 63.25 in | Wt 143.5 lb

## 2023-06-25 DIAGNOSIS — Z23 Encounter for immunization: Secondary | ICD-10-CM

## 2023-06-25 DIAGNOSIS — B351 Tinea unguium: Secondary | ICD-10-CM

## 2023-06-25 DIAGNOSIS — B353 Tinea pedis: Secondary | ICD-10-CM

## 2023-06-25 DIAGNOSIS — E119 Type 2 diabetes mellitus without complications: Secondary | ICD-10-CM

## 2023-06-25 DIAGNOSIS — E7800 Pure hypercholesterolemia, unspecified: Secondary | ICD-10-CM

## 2023-06-25 MED ORDER — TERBINAFINE HCL 250 MG PO TABS: ORAL_TABLET | ORAL | 0 refills | Status: DC

## 2023-06-25 NOTE — Progress Notes (Signed)
    Subjective:    Patient ID: Travis Oconnell, male   DOB: 1960-06-30, 63 y.o.   MRN: [id number]   HPI  Brother interprets   Toenail onychomycosis/tinea pedis:  Only took Terbinafine for 30 days.  He did not go back for refill.  He feels his toenails are better.    2.  Hyperlipidemia:  He only took Atorvastatin for 30 days after last visit in October.  Did not realize again that we are treating not curing.  3.  DM:  he is returning to get the Metformin it sounds like.  Weight gradually decreasing.  States he has good energy and appetite.  Suspect weight of 168 over a year ago was likely an error as weighed 160 regularly prior.    4.  GERD:  sounds like he is filling Omeprazole  5.  Needs Tdap and Shingrix.  We have not received shipment yet of the latter and he declines today regardless.    Current Meds  Medication Sig   atorvastatin (LIPITOR) 40 MG tablet Take 1 tablet (40 mg total) by mouth daily.   metFORMIN (GLUCOPHAGE-XR) 500 MG 24 hr tablet 1 tab by mouth twice daily with meals   omeprazole (PRILOSEC) 40 MG capsule Take 1 capsule (40 mg total) by mouth daily.   No Known Allergies   Review of Systems    Objective:   BP (!) 92/54 (BP Location: Right Arm, Patient Position: Sitting, Cuff Size: Normal)   Pulse 72   Resp 20   Ht 5' 3.25" (1.607 m)   Wt 143 lb 8 oz (65.1 kg)   BMI 25.22 kg/m   Physical Exam NAD HEENT:  PERRL, EOMI Neck:  Supple, No adenopathy Chest:  CTA CV:  RRR without murmur or rub.  Radial and DP pulses normal and equal LE:  No edema Feet:  Right great toenail possibly with clearing at base.  Thickened and discolored about 3/4 of length.  No flaking of feet.    Assessment & Plan   Toenail onychomycosis and tinea pedis:  the latter is resolved.  Restart 90 day treatment of toenails with Terbinafine.  Labs in 3 months.  2.  DM:  A1C stable in September.  Recheck with labs in 3 months.  3.  Hyperlipidemia:  Improved, but stopped treatment in  November.  Restart Atorvastatin and recheck labs in 3 months.  4.  HM:  PSA recently normal at 0.6 and Hep C and HIV screens negative.  Tdap today

## 2023-07-02 ENCOUNTER — Ambulatory Visit: Payer: Self-pay | Admitting: Internal Medicine

## 2023-09-27 ENCOUNTER — Other Ambulatory Visit: Payer: Self-pay

## 2023-09-27 DIAGNOSIS — E119 Type 2 diabetes mellitus without complications: Secondary | ICD-10-CM

## 2023-09-27 DIAGNOSIS — Z0000 Encounter for general adult medical examination without abnormal findings: Secondary | ICD-10-CM

## 2023-09-27 DIAGNOSIS — E7800 Pure hypercholesterolemia, unspecified: Secondary | ICD-10-CM

## 2023-09-28 LAB — LIPID PANEL W/O CHOL/HDL RATIO
Cholesterol, Total: 193 mg/dL (ref 100–199)
HDL: 50 mg/dL (ref >39)
LDL Chol Calc (NIH): 132 mg/dL — ABNORMAL HIGH (ref 0–99)
Triglycerides: 59 mg/dL (ref 0–149)
VLDL Cholesterol Cal: 11 mg/dL (ref 5–40)

## 2023-09-28 LAB — HEMOGLOBIN A1C
Est. average glucose Bld gHb Est-mCnc: 131 mg/dL
Hgb A1c MFr Bld: 6.2 % — ABNORMAL HIGH (ref 4.8–5.6)

## 2023-09-28 LAB — COMPREHENSIVE METABOLIC PANEL WITH GFR
ALT: 18 IU/L (ref 0–44)
AST: 24 IU/L (ref 0–40)
Albumin: 4.2 g/dL (ref 3.9–4.9)
Alkaline Phosphatase: 68 IU/L (ref 44–121)
BUN/Creatinine Ratio: 22 (ref 10–24)
BUN: 19 mg/dL (ref 8–27)
Bilirubin Total: 0.7 mg/dL (ref 0.0–1.2)
CO2: 24 mmol/L (ref 20–29)
Calcium: 9.4 mg/dL (ref 8.6–10.2)
Chloride: 102 mmol/L (ref 96–106)
Creatinine, Ser: 0.85 mg/dL (ref 0.76–1.27)
Globulin, Total: 2.4 g/dL (ref 1.5–4.5)
Glucose: 98 mg/dL (ref 70–99)
Potassium: 4.3 mmol/L (ref 3.5–5.2)
Sodium: 141 mmol/L (ref 134–144)
Total Protein: 6.6 g/dL (ref 6.0–8.5)
eGFR: 96 mL/min/{1.73_m2} (ref >59)

## 2023-10-03 ENCOUNTER — Ambulatory Visit: Payer: Self-pay | Admitting: Internal Medicine

## 2023-10-03 ENCOUNTER — Encounter: Payer: Self-pay | Admitting: Internal Medicine

## 2023-10-03 VITALS — BP 94/58 | HR 65 | Resp 16 | Ht 63.25 in | Wt 147.0 lb

## 2023-10-03 DIAGNOSIS — Z23 Encounter for immunization: Secondary | ICD-10-CM

## 2023-10-03 DIAGNOSIS — B351 Tinea unguium: Secondary | ICD-10-CM

## 2023-10-03 DIAGNOSIS — E7800 Pure hypercholesterolemia, unspecified: Secondary | ICD-10-CM

## 2023-10-03 DIAGNOSIS — E119 Type 2 diabetes mellitus without complications: Secondary | ICD-10-CM

## 2023-10-03 MED ORDER — EMPAGLIFLOZIN 10 MG PO TABS: 10.0000 mg | ORAL_TABLET | Freq: Every day | ORAL | 11 refills | Status: AC

## 2023-10-03 MED ORDER — ATORVASTATIN CALCIUM 40 MG PO TABS: 40.0000 mg | ORAL_TABLET | Freq: Every day | ORAL | 11 refills | Status: AC

## 2023-10-03 MED ORDER — METFORMIN HCL ER 500 MG PO TB24: ORAL_TABLET | ORAL | 11 refills | Status: AC

## 2023-10-03 NOTE — Progress Notes (Signed)
    Subjective:    Patient ID: Travis Oconnell, male   DOB: 1960-07-19, 63 y.o.   MRN: [id number]   HPI  Brother, Marzetta Solar, interprets.   Toenail onychomycosis:  Sounds like never obtained his orange card to be able to use [facility] pharmacy.  Apparently, once again, was given only 1 month.  Feels his right great toenail is continuing to improve.  Distal half still with discoloration and thickening.  2.  DM:  A1C at good level at 6.2% with just one dose daily of Metformin XR 500 mg.   3.  Hypercholesterolemia:  Total is high normal and LDL remains too high at 132.   Lipid Panel     Component Value Date/Time   CHOL 193 09/27/2023 0942   TRIG 59 09/27/2023 0942   HDL 50 09/27/2023 0942   LDLCALC 132 (H) 09/27/2023 0942   LABVLDL 11 09/27/2023 0942       Current Meds  Medication Sig   metFORMIN (GLUCOPHAGE-XR) 500 MG 24 hr tablet 1 tab by mouth twice daily with meals (Patient taking differently: 1 tab by mouth in the morning)   No Known Allergies   Review of Systems    Objective:   BP (!) 94/58 (BP Location: Right Arm, Patient Position: Sitting, Cuff Size: Normal)   Pulse 65   Resp 16   Ht 5' 3.25" (1.607 m)   Wt 147 lb (66.7 kg)   BMI 25.83 kg/m   Physical Exam NAD Lungs:  CTA CV:  RRR with normal S1 and S2, No S3, S4 or murmur.  Radial and DP pulses normal and equal LE:  No edema.  Right great toenail with distal half discolored and thickened.  Feet still with flaking.  Assessment & Plan   Toenail onychomycosis:  elects to wait and see if toenail continues to grow out normally before considering treatment with full 84 days.  2.  DM:  controlled.  Stay at once daily dosing of Metformin XR 500 mg and add Jardiance 10 mg daily.    3.  Hypercholesterolemia:  Rx for Atorvastatin sent to [facility] until he gets [facility] orange card renewed.  Check fasting labs in 4 months before CPE  4.  HM:  Shingrix #1/2

## 2024-03-16 ENCOUNTER — Other Ambulatory Visit: Payer: Self-pay

## 2024-03-16 DIAGNOSIS — E119 Type 2 diabetes mellitus without complications: Secondary | ICD-10-CM

## 2024-03-16 DIAGNOSIS — E7800 Pure hypercholesterolemia, unspecified: Secondary | ICD-10-CM

## 2024-03-16 DIAGNOSIS — Z0000 Encounter for general adult medical examination without abnormal findings: Secondary | ICD-10-CM

## 2024-03-17 LAB — COMPREHENSIVE METABOLIC PANEL WITH GFR
ALT: 30 IU/L (ref 0–44)
AST: 30 IU/L (ref 0–40)
Albumin: 4.3 g/dL (ref 3.9–4.9)
Alkaline Phosphatase: 72 IU/L (ref 47–123)
BUN/Creatinine Ratio: 17 (ref 10–24)
BUN: 16 mg/dL (ref 8–27)
Bilirubin Total: 0.9 mg/dL (ref 0.0–1.2)
CO2: 25 mmol/L (ref 20–29)
Calcium: 9 mg/dL (ref 8.6–10.2)
Chloride: 101 mmol/L (ref 96–106)
Creatinine, Ser: 0.94 mg/dL (ref 0.76–1.27)
Globulin, Total: 2.4 g/dL (ref 1.5–4.5)
Glucose: 100 mg/dL — ABNORMAL HIGH (ref 70–99)
Potassium: 4.1 mmol/L (ref 3.5–5.2)
Sodium: 140 mmol/L (ref 134–144)
Total Protein: 6.7 g/dL (ref 6.0–8.5)
eGFR: 90 mL/min/1.73 (ref >59)

## 2024-03-17 LAB — CBC WITH DIFFERENTIAL/PLATELET
Basophils Absolute: 0 x10E3/uL (ref 0.0–0.2)
Basos: 1 %
EOS (ABSOLUTE): 0.3 x10E3/uL (ref 0.0–0.4)
Eos: 9 %
Hematocrit: 44.9 % (ref 37.5–51.0)
Hemoglobin: 13.9 g/dL (ref 13.0–17.7)
Immature Grans (Abs): 0 x10E3/uL (ref 0.0–0.1)
Immature Granulocytes: 0 %
Lymphocytes Absolute: 1.3 x10E3/uL (ref 0.7–3.1)
Lymphs: 44 %
MCH: 27.9 pg (ref 26.6–33.0)
MCHC: 31 g/dL — ABNORMAL LOW (ref 31.5–35.7)
MCV: 90 fL (ref 79–97)
Monocytes Absolute: 0.3 x10E3/uL (ref 0.1–0.9)
Monocytes: 9 %
Neutrophils Absolute: 1.1 x10E3/uL — ABNORMAL LOW (ref 1.4–7.0)
Neutrophils: 37 %
Platelets: 137 x10E3/uL — ABNORMAL LOW (ref 150–450)
RBC: 4.99 x10E6/uL (ref 4.14–5.80)
RDW: 13.2 % (ref 11.6–15.4)
WBC: 3 x10E3/uL — ABNORMAL LOW (ref 3.4–10.8)

## 2024-03-17 LAB — LIPID PANEL
Chol/HDL Ratio: 2.8 ratio (ref 0.0–5.0)
Cholesterol, Total: 136 mg/dL (ref 100–199)
HDL: 48 mg/dL (ref >39)
LDL Chol Calc (NIH): 78 mg/dL (ref 0–99)
Triglycerides: 44 mg/dL (ref 0–149)
VLDL Cholesterol Cal: 10 mg/dL (ref 5–40)

## 2024-03-17 LAB — HEMOGLOBIN A1C
Est. average glucose Bld gHb Est-mCnc: 137 mg/dL
Hgb A1c MFr Bld: 6.4 % — ABNORMAL HIGH (ref 4.8–5.6)

## 2024-03-17 LAB — MICROALBUMIN / CREATININE URINE RATIO
Creatinine, Urine: 104.6 mg/dL
Microalb/Creat Ratio: <3 mg/g{creat} (ref 0–29)
Microalbumin, Urine: <3 ug/mL

## 2024-03-19 ENCOUNTER — Ambulatory Visit: Payer: Self-pay | Admitting: Internal Medicine

## 2024-03-19 ENCOUNTER — Encounter: Payer: Self-pay | Admitting: Internal Medicine

## 2024-03-19 VITALS — BP 100/72 | HR 72 | Resp 18 | Ht 63.0 in | Wt 145.0 lb

## 2024-03-19 DIAGNOSIS — Z23 Encounter for immunization: Secondary | ICD-10-CM

## 2024-03-19 DIAGNOSIS — Z0000 Encounter for general adult medical examination without abnormal findings: Secondary | ICD-10-CM

## 2024-03-19 DIAGNOSIS — Z0120 Encounter for dental examination and cleaning without abnormal findings: Secondary | ICD-10-CM

## 2024-03-19 DIAGNOSIS — E7800 Pure hypercholesterolemia, unspecified: Secondary | ICD-10-CM

## 2024-03-19 DIAGNOSIS — K219 Gastro-esophageal reflux disease without esophagitis: Secondary | ICD-10-CM

## 2024-03-19 DIAGNOSIS — H269 Unspecified cataract: Secondary | ICD-10-CM

## 2024-03-19 DIAGNOSIS — R12 Heartburn: Secondary | ICD-10-CM

## 2024-03-19 DIAGNOSIS — E119 Type 2 diabetes mellitus without complications: Secondary | ICD-10-CM

## 2024-03-19 MED ORDER — PANTOPRAZOLE SODIUM 40 MG PO TBEC: 40.0000 mg | DELAYED_RELEASE_TABLET | Freq: Every day | ORAL | 4 refills | Status: AC

## 2024-03-19 NOTE — Progress Notes (Signed)
 Subjective:    Patient ID: Travis Oconnell, male   DOB: 26-Sep-1960, 63 y.o.   MRN: [id number]   HPI  Brother interprets over phone at patient's request  Here for Male CPE:  1.  STE:  Does perform.  No concerning findings.  No family history of testicular cancer.   2.  PSA:  Last checked 06/19/2022.  Normal at 0.6.  No family history of prostate cancer.    3.  Guaiac Cards/FIT:  + in 2022 leading to EGD and colonoscopy with Dr. San, GI  4.  Colonoscopy: 2 adenomas in 2022 without high grade dysplasia, Dr. San.  Repeat planned for 08/2025.  5.  Cholesterol/Glucose:  Cholesterol almost at goal with Atorvastatin  40 mg.  A1C at goal, up to 6.4.% but thought his sugars were too low when in low 100s, so stopped Metformin .   Lipid Panel     Component Value Date/Time   CHOL 136 03/16/2024 0942   TRIG 44 03/16/2024 0942   HDL 48 03/16/2024 0942   CHOLHDL 2.8 03/16/2024 0942   LDLCALC 78 03/16/2024 0942   LABVLDL 10 03/16/2024 0942    6.  Immunizations:  Needs influenza and second Shingrix  today.  Also recommended COVID. Immunization History  Administered Date(s) Administered   Influenza, Mdck, Trivalent,PF 6+ MOS(egg free) 03/25/2023   Influenza,inj,Quad PF,6+ Mos 08/30/2017   Moderna Covid-19 Fall Seasonal Vaccine 74yrs & older 03/25/2023   PFIZER(Purple Top)SARS-COV-2 Vaccination 09/19/2019, 10/10/2019   PNEUMOCOCCAL CONJUGATE-20 05/30/2023   Tdap 06/25/2023   Zoster Recombinant(Shingrix ) 10/03/2023     7.  WBC down again--chronic issue with ranging to low normal in past years.  His platelet count is slight decreased as well.  Current Meds  Medication Sig   atorvastatin  (LIPITOR) 40 MG tablet Take 1 tablet (40 mg total) by mouth daily.   empagliflozin  (JARDIANCE ) 10 MG TABS tablet Take 1 tablet (10 mg total) by mouth daily before breakfast.   No Known Allergies  Past Medical History:  Diagnosis Date   GERD (gastroesophageal reflux disease)    H. 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  Social Drivers of Corporate investment banker Strain: Low Risk  (03/19/2024)   Overall Financial Resource Strain (CARDIA)    Difficulty of Paying Living Expenses: Not very hard  Food Insecurity: No Food Insecurity (03/19/2024)   Hunger Vital Sign    Worried About Running Out of Food in the Last Year: Never true    Ran Out of Food in the Last Year: Never true  Transportation Needs: No Transportation Needs (03/19/2024)   PRAPARE - Administrator, Civil Service (Medical): No    Lack of Transportation (Non-Medical): No  Physical Activity: Not on file  Stress: Not on file  Social Connections: Not on file  Intimate Partner Violence: Not At Risk (03/19/2024)   Humiliation, Afraid, Rape, and Kick questionnaire    Fear of Current or Ex-Partner: No     Emotionally Abused: No    Physically Abused: No    Sexually Abused: No      Review of Systems  HENT:  Negative for dental problem (Has not had dental appt.).   Eyes:  Negative for visual disturbance (He is followed by an opthalmologist in [place].  Had right cataract out this past year.  Has an appt scheduled in next year.).  Respiratory:  Negative for shortness of breath.   Cardiovascular:  Negative for chest pain, palpitations and leg swelling.  Gastrointestinal:  Negative for blood in stool (No melena.).       Heartburn:  recurred about 1 month ago.  Has had intermittent issues with this in past.  He does not have a PPI.    If eats too much or if goes without eating, he gets burning in epigastrium which goes up into throat at times.   He does not have HOB elevated--uses just pillows.   Some tomatoes.  No onions, tobacco, caffeine, chocolate, alcohol.        Objective:   BP 100/72 (BP Location: Left Arm, Patient Position: Sitting, Cuff Size: Normal)   Pulse 72   Resp 18   Ht 5' 3 (1.6 m)   Wt 145 lb (65.8 kg)   BMI 25.69 kg/m   Physical Exam HENT:     Head: Normocephalic and atraumatic.     Right Ear: Tympanic membrane, ear canal and external ear normal.     Left Ear: Tympanic membrane, ear canal and external ear normal.     Nose: Nose normal.     Mouth/Throat:     Mouth: Mucous membranes are moist.     Pharynx: Oropharynx is clear.  Eyes:     Extraocular Movements: Extraocular movements intact.     Conjunctiva/sclera: Conjunctivae normal.     Pupils: Pupils are equal, round, and reactive to light.     Comments: Discs sharp  Neck:     Thyroid: No thyroid mass or thyromegaly.  Cardiovascular:     Rate and Rhythm: Normal rate and regular rhythm.     Pulses:          Dorsalis pedis pulses are 2+ on the right side and 2+ on the left side.       Posterior tibial pulses are 2+ on the right side and 2+ on the left side.     Heart sounds: S1 normal and S2 normal. No murmur  heard.    No friction rub. No S3 or S4 sounds.  Pulmonary:     Effort: Pulmonary effort is normal.     Breath sounds: Normal breath sounds and air entry.  Abdominal:  General: Abdomen is flat. Bowel sounds are normal.     Palpations: Abdomen is soft. There is no hepatomegaly, splenomegaly or mass.     Tenderness: There is no abdominal tenderness.     Hernia: No hernia is present.  Genitourinary:    Penis: Normal.      Testes:        Right: Mass or tenderness not present. Right testis is descended.        Left: Mass or tenderness not present. Left testis is descended.  Musculoskeletal:        General: Normal range of motion.     Cervical back: Normal range of motion and neck supple.     Right lower leg: No edema.     Left lower leg: No edema.  Feet:     Right foot:     Protective Sensation: 10 sites tested.  10 sites sensed.     Skin integrity: Skin integrity normal.     Left foot:     Protective Sensation: 10 sites tested.  10 sites sensed.     Skin integrity: Skin integrity normal.  Lymphadenopathy:     Head:     Right side of head: No submental or submandibular adenopathy.     Left side of head: No submental or submandibular adenopathy.     Cervical: No cervical adenopathy.     Upper Body:     Right upper body: No supraclavicular adenopathy.     Left upper body: No supraclavicular adenopathy.     Lower Body: No right inguinal adenopathy. No left inguinal adenopathy.  Skin:    General: Skin is warm.     Capillary Refill: Capillary refill takes less than 2 seconds.     Findings: No rash.  Neurological:     General: No focal deficit present.     Mental Status: He is alert and oriented to person, place, and time.     Cranial Nerves: Cranial nerves 2-12 are intact.     Sensory: Sensation is intact.     Motor: Motor function is intact.     Coordination: Coordination is intact.     Gait: Gait is intact.     Deep Tendon Reflexes: Reflexes are normal and symmetric.   Psychiatric:        Mood and Affect: Mood normal.        Speech: Speech normal.        Behavior: Behavior normal. Behavior is cooperative.      Assessment & Plan   CPE Influenza and Spikevax /COVID today. Return in 2 weeks for Shingrix  #2/2   2.  DM:  at goal, but control not as good without Metformin  once daily.  He will restart.  Eye referral  3.  Hyperlipidemia:  LDL not quite at goal with Atorvastatin .  He will work on diet and physcial activity to get down.  Recheck in 6 months before follow up.  4.  Recurrently low WBC, mild, but now with slightly lower platelets as well:  recheck in 2 weeks when in for COVID vaccination  5.  GERD and possibly PUD:  to take Pantoprazole  40 mg on empty stomach every morning for 2 months and then wean gradually and use as needed thereafter.   6.  Need for dental care:  referral to dental clinic.

## 2024-03-20 ENCOUNTER — Other Ambulatory Visit: Payer: Self-pay

## 2024-04-10 ENCOUNTER — Other Ambulatory Visit: Payer: Self-pay

## 2024-05-08 ENCOUNTER — Other Ambulatory Visit (INDEPENDENT_AMBULATORY_CARE_PROVIDER_SITE_OTHER): Payer: Self-pay

## 2024-05-08 DIAGNOSIS — D696 Thrombocytopenia, unspecified: Secondary | ICD-10-CM

## 2024-05-09 LAB — CBC WITH DIFFERENTIAL/PLATELET
Basophils Absolute: 0.1 x10E3/uL (ref 0.0–0.2)
Basos: 2 %
EOS (ABSOLUTE): 0.3 x10E3/uL (ref 0.0–0.4)
Eos: 10 %
Hematocrit: 46.2 % (ref 37.5–51.0)
Hemoglobin: 14.6 g/dL (ref 13.0–17.7)
Immature Grans (Abs): 0 x10E3/uL (ref 0.0–0.1)
Immature Granulocytes: 0 %
Lymphocytes Absolute: 1.2 x10E3/uL (ref 0.7–3.1)
Lymphs: 44 %
MCH: 28.1 pg (ref 26.6–33.0)
MCHC: 31.6 g/dL (ref 31.5–35.7)
MCV: 89 fL (ref 79–97)
Monocytes Absolute: 0.2 x10E3/uL (ref 0.1–0.9)
Monocytes: 7 %
Neutrophils Absolute: 1 x10E3/uL — ABNORMAL LOW (ref 1.4–7.0)
Neutrophils: 37 %
Platelets: 164 x10E3/uL (ref 150–450)
RBC: 5.19 x10E6/uL (ref 4.14–5.80)
RDW: 13.1 % (ref 11.6–15.4)
WBC: 2.7 x10E3/uL — ABNORMAL LOW (ref 3.4–10.8)

## 2024-07-14 ENCOUNTER — Ambulatory Visit: Payer: Self-pay | Admitting: Internal Medicine

## 2024-07-16 NOTE — Progress Notes (Signed)
 The patient is notified.

## 2024-09-21 ENCOUNTER — Other Ambulatory Visit: Payer: Self-pay

## 2024-09-24 ENCOUNTER — Ambulatory Visit: Payer: Self-pay | Admitting: Internal Medicine

## 2025-03-19 ENCOUNTER — Other Ambulatory Visit: Payer: Self-pay | Admitting: Internal Medicine

## 2025-03-22 ENCOUNTER — Encounter: Payer: Self-pay | Admitting: Internal Medicine
